# Patient Record
Sex: Female | Born: 1945 | Race: White | Marital: Single | Smoking: Former smoker
Health system: Southern US, Community
[De-identification: ages and names within clinical notes are randomized; demographics above are authoritative.]

## PROBLEM LIST (undated history)

## (undated) DIAGNOSIS — M1990 Unspecified osteoarthritis, unspecified site: Secondary | ICD-10-CM

## (undated) DIAGNOSIS — I7300 Raynaud's syndrome without gangrene: Secondary | ICD-10-CM

## (undated) DIAGNOSIS — T7840XA Allergy, unspecified, initial encounter: Secondary | ICD-10-CM

## (undated) DIAGNOSIS — C801 Malignant (primary) neoplasm, unspecified: Secondary | ICD-10-CM

## (undated) DIAGNOSIS — M8580 Other specified disorders of bone density and structure, unspecified site: Secondary | ICD-10-CM

## (undated) DIAGNOSIS — E785 Hyperlipidemia, unspecified: Secondary | ICD-10-CM

## (undated) DIAGNOSIS — K219 Gastro-esophageal reflux disease without esophagitis: Secondary | ICD-10-CM

## (undated) HISTORY — DX: Gastro-esophageal reflux disease without esophagitis: K21.9

## (undated) HISTORY — PX: HAND SURGERY: SHX662

## (undated) HISTORY — DX: Hyperlipidemia, unspecified: E78.5

## (undated) HISTORY — DX: Raynaud's syndrome without gangrene: I73.00

## (undated) HISTORY — PX: EYE SURGERY: SHX253

## (undated) HISTORY — DX: Unspecified osteoarthritis, unspecified site: M19.90

## (undated) HISTORY — DX: Malignant (primary) neoplasm, unspecified: C80.1

## (undated) HISTORY — PX: COLONOSCOPY: SHX174

## (undated) HISTORY — DX: Other specified disorders of bone density and structure, unspecified site: M85.80

## (undated) HISTORY — PX: CATARACT EXTRACTION: SUR2

## (undated) HISTORY — DX: Allergy, unspecified, initial encounter: T78.40XA

---
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
## 1977-06-24 HISTORY — PX: UMBILICAL HERNIA REPAIR: SHX196

## 1978-06-24 HISTORY — PX: TUBAL LIGATION: SHX77

## 1996-06-24 DIAGNOSIS — C4441 Basal cell carcinoma of skin of scalp and neck: Secondary | ICD-10-CM | POA: Insufficient documentation

## 1996-06-24 DIAGNOSIS — C801 Malignant (primary) neoplasm, unspecified: Secondary | ICD-10-CM

## 1996-06-24 HISTORY — DX: Malignant (primary) neoplasm, unspecified: C80.1

## 2002-06-24 HISTORY — PX: CATARACT EXTRACTION W/ INTRAOCULAR LENS IMPLANT: SHX1309

## 2006-06-24 HISTORY — PX: CYSTECTOMY: SUR359

## 2011-10-04 ENCOUNTER — Encounter: Payer: Self-pay | Admitting: Internal Medicine

## 2011-11-11 ENCOUNTER — Encounter: Payer: Self-pay | Admitting: Internal Medicine

## 2011-11-11 ENCOUNTER — Ambulatory Visit (AMBULATORY_SURGERY_CENTER): Payer: BC Managed Care – PPO | Admitting: *Deleted

## 2011-11-11 VITALS — Ht 63.0 in | Wt 129.0 lb

## 2011-11-11 DIAGNOSIS — Z1211 Encounter for screening for malignant neoplasm of colon: Secondary | ICD-10-CM

## 2011-11-11 MED ORDER — PEG-KCL-NACL-NASULF-NA ASC-C 100 G PO SOLR: ORAL | Status: DC

## 2011-11-14 ENCOUNTER — Encounter: Payer: Self-pay | Admitting: Internal Medicine

## 2011-11-27 ENCOUNTER — Ambulatory Visit (AMBULATORY_SURGERY_CENTER): Payer: BC Managed Care – PPO | Admitting: Internal Medicine

## 2011-11-27 ENCOUNTER — Encounter: Payer: Self-pay | Admitting: Internal Medicine

## 2011-11-27 VITALS — BP 131/75 | HR 68 | Temp 97.1°F | Resp 16 | Ht 63.0 in | Wt 129.0 lb

## 2011-11-27 DIAGNOSIS — Z1211 Encounter for screening for malignant neoplasm of colon: Secondary | ICD-10-CM

## 2011-11-27 DIAGNOSIS — Z800 Family history of malignant neoplasm of digestive organs: Secondary | ICD-10-CM

## 2011-11-27 MED ORDER — SODIUM CHLORIDE 0.9 % IV SOLN: 500.0000 mL | INTRAVENOUS | Status: DC

## 2011-11-27 NOTE — Progress Notes (Signed)
Patient did not experience any of the following events: a burn prior to discharge; a fall within the facility; wrong site/side/patient/procedure/implant event; or a hospital transfer or hospital admission upon discharge from the facility. (G8907) Patient did not have preoperative order for IV antibiotic SSI prophylaxis. (G8918)  

## 2011-11-27 NOTE — Op Note (Addendum)
[facility] [address] [place], [place]  [address]  COLONOSCOPY PROCEDURE REPORT  PATIENT:  Erin Cruz, Erin Cruz  MR#:  [id number] BIRTHDATE:  1946-05-24, 65 yrs. old  GENDER:  female ENDOSCOPIST:  Hedwig Morton. Juanda Chance, MD REF. BY:  no PCP, pt just moved from [place] PROCEDURE DATE:  11/27/2011 PROCEDURE:  Colonoscopy 27253 ASA CLASS:  Class I INDICATIONS:  colorectal cancer screening, father with colon cancer MEDICATIONS:   MAC sedation, administered by CRNA, propofol (Diprivan) 250 mg  DESCRIPTION OF PROCEDURE:   After the risks and benefits and of the procedure were explained, informed consent was obtained. Digital rectal exam was performed and revealed no rectal masses. The LB CF-[id number] endoscope was introduced through the anus and advanced to the cecum, which was identified by both the appendix and ileocecal valve.  The quality of the prep was good, using MoviPrep.  The instrument was then slowly withdrawn as the colon was fully examined. <<PROCEDUREIMAGES>>  FINDINGS:  No polyps or cancers were seen (see image1, image4, image3, image5, and image6). long redundant tortuous colon, difficult to traverse   Retroflexed views in the rectum revealed no abnormalities.    The scope was then withdrawn from the patient and the procedure completed.  COMPLICATIONS:  None ENDOSCOPIC IMPRESSION: 1) No polyps or cancers 2) Normal colonoscopy long redundant colon RECOMMENDATIONS: 1) High fiber diet.  REPEAT EXAM:  In 5 year(s) for.  ______________________________ Hedwig Morton. Juanda Chance, MD  CC:  n. REVISED:  11/27/2011 12:31 PM eSIGNED:   Hedwig Morton. Savvas Roper at 11/27/2011 12:31 PM  Maxie Better, [phone]

## 2011-11-27 NOTE — Patient Instructions (Signed)
YOU HAD AN ENDOSCOPIC PROCEDURE TODAY AT THE [facility]: Refer to the procedure report that was given to you for any specific questions about what was found during the examination.  If the procedure report does not answer your questions, please call your gastroenterologist to clarify.  If you requested that your care partner not be given the details of your procedure findings, then the procedure report has been included in a sealed envelope for you to review at your convenience later.  YOU SHOULD EXPECT: Some feelings of bloating in the abdomen. Passage of more gas than usual.  Walking can help get rid of the air that was put into your GI tract during the procedure and reduce the bloating. If you had a lower endoscopy (such as a colonoscopy or flexible sigmoidoscopy) you may notice spotting of blood in your stool or on the toilet paper. If you underwent a bowel prep for your procedure, then you may not have a normal bowel movement for a few days.  DIET: Your first meal following the procedure should be a light meal and then it is ok to progress to your normal diet.  A half-sandwich or bowl of soup is an example of a good first meal.  Heavy or fried foods are harder to digest and may make you feel nauseous or bloated.  Likewise meals heavy in dairy and vegetables can cause extra gas to form and this can also increase the bloating.  Drink plenty of fluids but you should avoid alcoholic beverages for 24 hours.  ACTIVITY: Your care partner should take you home directly after the procedure.  You should plan to take it easy, moving slowly for the rest of the day.  You can resume normal activity the day after the procedure however you should NOT DRIVE or use heavy machinery for 24 hours (because of the sedation medicines used during the test).    SYMPTOMS TO REPORT IMMEDIATELY: A gastroenterologist can be reached at any hour.  During normal business hours, 8:30 AM to 5:00 PM Monday through Friday,  call [phone].  After hours and on weekends, please call the GI answering service at [phone] who will take a message and have the physician on call contact you.   Following lower endoscopy (colonoscopy or flexible sigmoidoscopy):  Excessive amounts of blood in the stool  Significant tenderness or worsening of abdominal pains  Swelling of the abdomen that is new, acute  Fever of 100F or higher  FOLLOW UP:  Our staff will call the home number listed on your records the next business day following your procedure to check on you and address any questions or concerns that you may have at that time regarding the information given to you following your procedure. This is a courtesy call and so if there is no answer at the home number and we have not heard from you through the emergency physician on call, we will assume that you have returned to your regular daily activities without incident.  SIGNATURES/CONFIDENTIALITY: You and/or your care partner have signed paperwork which will be entered into your electronic medical record.  These signatures attest to the fact that that the information above on your After Visit Summary has been reviewed and is understood.  Full responsibility of the confidentiality of this discharge information lies with you and/or your care-partner.  

## 2011-11-28 ENCOUNTER — Telehealth: Payer: Self-pay

## 2011-11-28 NOTE — Telephone Encounter (Signed)
Left message on answering machine. 

## 2012-03-11 ENCOUNTER — Encounter: Payer: Self-pay | Admitting: Physician Assistant

## 2012-03-11 ENCOUNTER — Ambulatory Visit (INDEPENDENT_AMBULATORY_CARE_PROVIDER_SITE_OTHER): Payer: BC Managed Care – PPO | Admitting: Physician Assistant

## 2012-03-11 VITALS — BP 122/78 | HR 75 | Temp 98.1°F | Resp 16 | Ht 62.0 in | Wt 133.0 lb

## 2012-03-11 DIAGNOSIS — E785 Hyperlipidemia, unspecified: Secondary | ICD-10-CM

## 2012-03-11 DIAGNOSIS — Z0000 Encounter for general adult medical examination without abnormal findings: Secondary | ICD-10-CM

## 2012-03-11 DIAGNOSIS — Z1239 Encounter for other screening for malignant neoplasm of breast: Secondary | ICD-10-CM

## 2012-03-11 DIAGNOSIS — Z23 Encounter for immunization: Secondary | ICD-10-CM

## 2012-03-11 DIAGNOSIS — M899 Disorder of bone, unspecified: Secondary | ICD-10-CM

## 2012-03-11 DIAGNOSIS — M949 Disorder of cartilage, unspecified: Secondary | ICD-10-CM

## 2012-03-11 DIAGNOSIS — M8580 Other specified disorders of bone density and structure, unspecified site: Secondary | ICD-10-CM

## 2012-03-11 DIAGNOSIS — N289 Disorder of kidney and ureter, unspecified: Secondary | ICD-10-CM

## 2012-03-11 LAB — LIPID PANEL
Cholesterol: 254 mg/dL — ABNORMAL HIGH (ref 0–200)
Total CHOL/HDL Ratio: 5.6 Ratio
Triglycerides: 87 mg/dL (ref <150)
VLDL: 17 mg/dL (ref 0–40)

## 2012-03-11 LAB — CBC WITH DIFFERENTIAL/PLATELET
Eosinophils Absolute: 0.2 10*3/uL (ref 0.0–0.7)
HCT: 41.7 % (ref 36.0–46.0)
Hemoglobin: 14.1 g/dL (ref 12.0–15.0)
Lymphs Abs: 1.5 10*3/uL (ref 0.7–4.0)
MCH: 29.7 pg (ref 26.0–34.0)
Monocytes Absolute: 0.6 10*3/uL (ref 0.1–1.0)
Monocytes Relative: 10 % (ref 3–12)
Neutrophils Relative %: 59 % (ref 43–77)
RBC: 4.74 MIL/uL (ref 3.87–5.11)

## 2012-03-11 LAB — POCT URINALYSIS DIPSTICK
Blood, UA: NEGATIVE
Protein, UA: NEGATIVE
Spec Grav, UA: 1.02
Urobilinogen, UA: 0.2

## 2012-03-11 LAB — COMPREHENSIVE METABOLIC PANEL
Albumin: 4.2 g/dL (ref 3.5–5.2)
Alkaline Phosphatase: 67 U/L (ref 39–117)
BUN: 15 mg/dL (ref 6–23)
Creat: 0.96 mg/dL (ref 0.50–1.10)
Glucose, Bld: 78 mg/dL (ref 70–99)
Potassium: 4.5 mEq/L (ref 3.5–5.3)
Total Bilirubin: 0.8 mg/dL (ref 0.3–1.2)

## 2012-03-12 ENCOUNTER — Encounter: Payer: Self-pay | Admitting: Physician Assistant

## 2012-03-12 NOTE — Progress Notes (Signed)
  Subjective:    Patient ID: Erin Cruz, female    DOB: 1946/03/01, 66 y.o.   MRN: [id number]  HPI 66 yr old CF presents to establish as PCP and for CPE. Last mmg was in 2012.  H/o osteopenia and took fosamax for years.  She isn't sure when her last dexa scan was. She moved here from up [place].  Her sister lives in [place]. No h/o abnormal pap or MMG.   Review of Systems  All other systems reviewed and are negative.       Objective:   Physical Exam  Nursing note and vitals reviewed. Constitutional: She is oriented to person, place, and time. She appears well-developed and well-nourished.  HENT:  Head: Normocephalic and atraumatic.  Right Ear: External ear normal.  Left Ear: External ear normal.  Nose: Nose normal.  Mouth/Throat: Oropharynx is clear and moist. No oropharyngeal exudate.  Eyes: Conjunctivae normal and EOM are normal. Pupils are equal, round, and reactive to light. Right eye exhibits no discharge. Left eye exhibits no discharge. No scleral icterus.  Neck: Normal range of motion. Neck supple. No thyromegaly present.  Cardiovascular: Normal rate, regular rhythm, normal heart sounds and intact distal pulses.   Pulmonary/Chest: Effort normal and breath sounds normal. Right breast exhibits no inverted nipple, no mass, no nipple discharge, no skin change and no tenderness. Left breast exhibits no inverted nipple, no mass, no nipple discharge, no skin change and no tenderness. Breasts are symmetrical.  Abdominal: Soft. Bowel sounds are normal.  Musculoskeletal: Normal range of motion.  Lymphadenopathy:       Head (right side): No preauricular, no posterior auricular and no occipital adenopathy present.       Head (left side): No preauricular, no posterior auricular and no occipital adenopathy present.    She has no cervical adenopathy.       Right cervical: No posterior cervical adenopathy present.      Left cervical: No posterior cervical adenopathy present.    She  has no axillary adenopathy.       Right axillary: No pectoral and no lateral adenopathy present.       Left axillary: No pectoral and no lateral adenopathy present.      Right: No supraclavicular adenopathy present.       Left: No supraclavicular adenopathy present.  Neurological: She is alert and oriented to person, place, and time. She displays normal reflexes. She exhibits normal muscle tone.  Skin: Skin is warm and dry.  Psychiatric: She has a normal mood and affect. Her behavior is normal.        Assessment & Plan:  CPE-healthy female Set up MMG. Check labs. Patient will check status of when her last dexa scan was.

## 2012-03-13 MED ORDER — LOVASTATIN 20 MG PO TABS: 20.0000 mg | ORAL_TABLET | Freq: Every day | ORAL | Status: DC

## 2012-03-13 NOTE — Addendum Note (Signed)
Addended by: Anders Simmonds on: 03/13/2012 11:04 AM   Modules accepted: Orders

## 2012-03-25 ENCOUNTER — Telehealth: Payer: Self-pay

## 2012-03-25 DIAGNOSIS — Z139 Encounter for screening, unspecified: Secondary | ICD-10-CM

## 2012-03-25 NOTE — Telephone Encounter (Signed)
Ellie was seen 03/11/12 and needs to have mammogram referral put in system.     [phone]

## 2012-03-25 NOTE — Telephone Encounter (Signed)
Spoke w/pt and advised that we have started referral for her mammogram and she will be contacted when appt is set up.

## 2012-03-30 ENCOUNTER — Telehealth: Payer: Self-pay

## 2012-03-30 MED ORDER — ZOSTER VACCINE LIVE 19400 UNT/0.65ML ~~LOC~~ SOLR: 0.6500 mL | Freq: Once | SUBCUTANEOUS | Status: DC

## 2012-03-30 NOTE — Telephone Encounter (Signed)
Called pt [facility] RX sent.

## 2012-03-30 NOTE — Telephone Encounter (Signed)
Erin Cruz would like shingles vaccine prescription sent into [facility].  [phone]

## 2012-03-30 NOTE — Telephone Encounter (Signed)
zostavax pended, please sign if appropriate.

## 2012-03-30 NOTE — Telephone Encounter (Signed)
done

## 2012-04-21 ENCOUNTER — Ambulatory Visit
Admission: RE | Admit: 2012-04-21 | Discharge: 2012-04-21 | Disposition: A | Discharge status: Home / Self Care | Payer: BC Managed Care – PPO | Source: Ambulatory Visit | Attending: Physician Assistant | Admitting: Physician Assistant

## 2012-04-21 DIAGNOSIS — Z139 Encounter for screening, unspecified: Secondary | ICD-10-CM

## 2012-07-23 ENCOUNTER — Telehealth: Payer: Self-pay

## 2012-07-23 NOTE — Telephone Encounter (Signed)
Copy of labs from last visit  Dennie Bible will pick up when ready   [phone]

## 2012-07-23 NOTE — Telephone Encounter (Signed)
Ready for pickup. Left message for patient.

## 2012-09-23 ENCOUNTER — Ambulatory Visit: Payer: Self-pay | Admitting: Physician Assistant

## 2012-09-30 ENCOUNTER — Ambulatory Visit (INDEPENDENT_AMBULATORY_CARE_PROVIDER_SITE_OTHER): Payer: BC Managed Care – PPO | Admitting: Family Medicine

## 2012-09-30 ENCOUNTER — Encounter: Payer: Self-pay | Admitting: Family Medicine

## 2012-09-30 VITALS — BP 112/69 | HR 70 | Temp 97.9°F | Resp 16 | Ht 62.0 in | Wt 132.0 lb

## 2012-09-30 DIAGNOSIS — E7800 Pure hypercholesterolemia, unspecified: Secondary | ICD-10-CM

## 2012-09-30 LAB — LIPID PANEL
Cholesterol: 203 mg/dL — ABNORMAL HIGH (ref 0–200)
Total CHOL/HDL Ratio: 4.1 Ratio
Triglycerides: 63 mg/dL (ref <150)

## 2012-09-30 LAB — ALT: ALT: 17 U/L (ref 0–35)

## 2012-09-30 NOTE — Patient Instructions (Signed)

## 2012-10-01 ENCOUNTER — Encounter: Payer: Self-pay | Admitting: Family Medicine

## 2012-10-01 NOTE — Progress Notes (Signed)
S:  This 67 y.o. Cauc female is here for lab draw; she was started on Lovastatin 20 mg in Sept 2013. Pt reports normal cholesterol for years but the number has gradually crept up in last year despite heathy nutrition and active lifestyle.  There is a family hx of high lipids. Pt denies myalgias, arthralgias or GI problems w/ medications.  O:  Filed Vitals:   09/30/12 1104  BP: 112/69  Pulse: 70  Temp: 97.9 F (36.6 C)  Resp: 16   GEN: In AND: WN,WD. HENT: [place]/AT; EOMI w/ clear conj/sclerae. Otherwise normal. COR: RRR. LUNGS: Normal resp rate and effort. MS: MAEs; no deformities. NEURO: A&O x 3; CNs intact. Nonfocal.  A/P: Pure hypercholesterolemia - Continue Lovastatin 20 mg pending labs.  Plan: TSH, T4, Free, ALT, Lipid panel

## 2013-04-08 ENCOUNTER — Encounter: Payer: Self-pay | Admitting: Family Medicine

## 2013-04-08 ENCOUNTER — Ambulatory Visit (INDEPENDENT_AMBULATORY_CARE_PROVIDER_SITE_OTHER): Payer: Medicare Other | Admitting: Family Medicine

## 2013-04-08 VITALS — BP 120/70 | HR 68 | Temp 97.6°F | Resp 16 | Ht 62.5 in | Wt 132.0 lb

## 2013-04-08 DIAGNOSIS — T887XXA Unspecified adverse effect of drug or medicament, initial encounter: Secondary | ICD-10-CM

## 2013-04-08 DIAGNOSIS — E7800 Pure hypercholesterolemia, unspecified: Secondary | ICD-10-CM

## 2013-04-08 DIAGNOSIS — T50905A Adverse effect of unspecified drugs, medicaments and biological substances, initial encounter: Secondary | ICD-10-CM

## 2013-04-08 DIAGNOSIS — Z1231 Encounter for screening mammogram for malignant neoplasm of breast: Secondary | ICD-10-CM

## 2013-04-08 DIAGNOSIS — Z23 Encounter for immunization: Secondary | ICD-10-CM

## 2013-04-08 DIAGNOSIS — Z8739 Personal history of other diseases of the musculoskeletal system and connective tissue: Secondary | ICD-10-CM

## 2013-04-08 NOTE — Patient Instructions (Signed)
Discontinue Lovastatin due to muscle aches..Increase Fish Oil to twice a day and try the [facility] brand.  Research the MEDITERRANEAN DIET and stay physically active. Lipid panel can be rechecked in 4 months; I will order the test and you can come in without an appointment to get the labs drawn.

## 2013-04-09 ENCOUNTER — Encounter: Payer: Self-pay | Admitting: Family Medicine

## 2013-04-09 NOTE — Progress Notes (Signed)
S:  This 67 y.o. Cauc female returns for follow-up re: elevated cholesterol and medication treatment w/ lovastatin. Pt has discontinued the medication due to muscle aches. Initially she attributed  Myalgias to increased activity level; but the discomfort persisted even w/ sedentary activity. She does not want to try another medication/statin. The muscles aches resolved after statin was discontinued. She denies abd pain, GI upset or jaundice.  Pt also has a hx of osteopenia; last DEXA > 10 years ago. Her previous physician discussed prescription ERT because she went through early menopause about 20 years ago; she declined this treatment and has taken calcium + D supplement and maintains active lifestyle.  Patient Active Problem List   Diagnosis Date Noted  . Pure hypercholesterolemia 04/08/2013   PMHx, Soc Hx and Fam Hx reviewed.  ROS: AS per HPI>  O: Filed Vitals:   04/08/13 0845  BP: 120/70  Pulse: 68  Temp: 97.6 F (36.4 C)  Resp: 16   GEN: In NAD; WN,WD. HENT: [place]/AT; EOMI w/ clear conj/sclerae. Otherwise unremarkable. COR: RRR. No edema. LUNGS: Normal resp rate and effort. SKIN: W&D; intact w/o erythema, rashes, jaundice or pallor. MS: MAEs; no muscle tenderness or joint effusions or deformities. No c/c/e. NEURO: A&O x 3; CNs intact. Nonfocal.  A/P: Pure hypercholesterolemia - Statin intolerance. Continue Fish Oil; pt to try [employer] and further diet modifications. CVD risk is about average; pt made aware of this.    Plan: Lipid panel, Comprehensive metabolic panel  Medication side effect, initial encounter- Discontinue lovastatin.  Need for prophylactic vaccination and inoculation against influenza - Plan: Flu Vaccine QUAD 36+ mos IM  History of osteopenia - Plan: DG Bone Density  Other screening mammogram - Plan: MM Digital Screening  Meds ordered this encounter  Medications  . Coenzyme Q10 (CO Q 10 PO)    Sig: Take 300 mg by mouth.

## 2013-05-12 ENCOUNTER — Ambulatory Visit
Admission: RE | Admit: 2013-05-12 | Discharge: 2013-05-12 | Disposition: A | Discharge status: Home / Self Care | Payer: Medicare Other | Source: Ambulatory Visit | Attending: Family Medicine | Admitting: Family Medicine

## 2013-05-12 DIAGNOSIS — Z1231 Encounter for screening mammogram for malignant neoplasm of breast: Secondary | ICD-10-CM

## 2013-05-12 DIAGNOSIS — Z8739 Personal history of other diseases of the musculoskeletal system and connective tissue: Secondary | ICD-10-CM

## 2013-05-14 ENCOUNTER — Encounter: Payer: Self-pay | Admitting: Family Medicine

## 2013-10-04 ENCOUNTER — Other Ambulatory Visit (INDEPENDENT_AMBULATORY_CARE_PROVIDER_SITE_OTHER): Payer: Medicare Other | Admitting: *Deleted

## 2013-10-04 DIAGNOSIS — E7800 Pure hypercholesterolemia, unspecified: Secondary | ICD-10-CM

## 2013-10-04 DIAGNOSIS — Z139 Encounter for screening, unspecified: Secondary | ICD-10-CM

## 2013-10-04 LAB — CBC
HEMATOCRIT: 37.5 % (ref 36.0–46.0)
HEMOGLOBIN: 12.8 g/dL (ref 12.0–15.0)
MCH: 29.2 pg (ref 26.0–34.0)
MCHC: 34.1 g/dL (ref 30.0–36.0)
MCV: 85.6 fL (ref 78.0–100.0)
Platelets: 240 10*3/uL (ref 150–400)
RBC: 4.38 MIL/uL (ref 3.87–5.11)
RDW: 14.1 % (ref 11.5–15.5)
WBC: 4.9 10*3/uL (ref 4.0–10.5)

## 2013-10-04 LAB — COMPLETE METABOLIC PANEL WITH GFR
ALBUMIN: 4 g/dL (ref 3.5–5.2)
ALT: 13 U/L (ref 0–35)
AST: 17 U/L (ref 0–37)
Alkaline Phosphatase: 66 U/L (ref 39–117)
BUN: 17 mg/dL (ref 6–23)
CHLORIDE: 107 meq/L (ref 96–112)
CO2: 26 mEq/L (ref 19–32)
Calcium: 9 mg/dL (ref 8.4–10.5)
Creat: 0.97 mg/dL (ref 0.50–1.10)
GFR, EST NON AFRICAN AMERICAN: 61 mL/min
GFR, Est African American: 71 mL/min
GLUCOSE: 83 mg/dL (ref 70–99)
POTASSIUM: 4.4 meq/L (ref 3.5–5.3)
Sodium: 141 mEq/L (ref 135–145)
TOTAL PROTEIN: 6.3 g/dL (ref 6.0–8.3)
Total Bilirubin: 0.5 mg/dL (ref 0.2–1.2)

## 2013-10-04 LAB — LIPID PANEL
CHOL/HDL RATIO: 5.4 ratio
Cholesterol: 222 mg/dL — ABNORMAL HIGH (ref 0–200)
HDL: 41 mg/dL (ref >39)
LDL Cholesterol: 168 mg/dL — ABNORMAL HIGH (ref 0–99)
Triglycerides: 65 mg/dL (ref <150)
VLDL: 13 mg/dL (ref 0–40)

## 2013-10-04 LAB — TSH: TSH: 3.208 u[IU]/mL (ref 0.350–4.500)

## 2013-10-04 NOTE — Progress Notes (Signed)
Patient here to have blood drawn for CPE on 10/05/2013. Dr Lorelei Pont gave orders on 10/04/2013.

## 2013-10-05 ENCOUNTER — Encounter: Payer: Self-pay | Admitting: Family Medicine

## 2013-10-05 ENCOUNTER — Ambulatory Visit (INDEPENDENT_AMBULATORY_CARE_PROVIDER_SITE_OTHER): Payer: Medicare Other | Admitting: Family Medicine

## 2013-10-05 VITALS — BP 122/70 | HR 64 | Temp 98.0°F | Resp 16 | Ht 61.0 in | Wt 133.4 lb

## 2013-10-05 DIAGNOSIS — E7800 Pure hypercholesterolemia, unspecified: Secondary | ICD-10-CM

## 2013-10-05 DIAGNOSIS — Z0000 Encounter for general adult medical examination without abnormal findings: Secondary | ICD-10-CM

## 2013-10-05 DIAGNOSIS — Z01419 Encounter for gynecological examination (general) (routine) without abnormal findings: Secondary | ICD-10-CM

## 2013-10-05 DIAGNOSIS — M5136 Other intervertebral disc degeneration, lumbar region: Secondary | ICD-10-CM | POA: Insufficient documentation

## 2013-10-05 DIAGNOSIS — Z124 Encounter for screening for malignant neoplasm of cervix: Secondary | ICD-10-CM

## 2013-10-05 DIAGNOSIS — Z139 Encounter for screening, unspecified: Secondary | ICD-10-CM

## 2013-10-05 LAB — IFOBT (OCCULT BLOOD): IFOBT: NEGATIVE

## 2013-10-05 LAB — POCT URINALYSIS DIPSTICK
Bilirubin, UA: NEGATIVE
Glucose, UA: NEGATIVE
Ketones, UA: NEGATIVE
Leukocytes, UA: NEGATIVE
Nitrite, UA: NEGATIVE
PH UA: 5
PROTEIN UA: NEGATIVE
Urobilinogen, UA: 0.2

## 2013-10-05 NOTE — Progress Notes (Signed)
Subjective:    Patient ID: Erin Cruz, female    DOB: 1945/08/13, 68 y.o.   MRN: [id number]  HPI  This 68 y.o. Cauc female is here for [facility] [place]. She is in good health w/ only chronic medical problems being familial hypercholesterolemia and lumbar DDD. Statins cause myalgias so she is interested in other non-pharmacologic means of lowering cholesterol. DDD is being treated conservatively by Dr. Rennis Harding.  HCM: MMG- Current.           PAP- > 3 years.           IMM- Needs Tdap/Tetanus.            CRS- Current.  Patient Active Problem List   Diagnosis Date Noted  . Pure hypercholesterolemia 04/08/2013   Prior to Admission medications   Medication Sig Start Date End Date Taking? Authorizing Provider  calcium citrate-vitamin D (CITRACAL+D) 315-200 MG-UNIT per tablet Take 1 tablet by mouth 2 (two) times daily.   Yes Historical Provider, MD  fish oil-omega-3 fatty acids 1000 MG capsule Take 1,200 mg by mouth daily.   Yes Historical Provider, MD  glucosamine-chondroitin 500-400 MG tablet Take 1 tablet by mouth 3 (three) times daily.   Yes Historical Provider, MD  meloxicam (MOBIC) 15 MG tablet Take 15 mg by mouth daily.   Yes Historical Provider, MD  predniSONE (DELTASONE) 5 MG tablet Take 5 mg by mouth daily with breakfast.   Yes Historical Provider, MD  ranitidine (ZANTAC) 150 MG tablet Take 150 mg by mouth as needed.   Yes Historical Provider, MD  aspirin 81 MG tablet Take 81 mg by mouth every other day.    Historical Provider, MD  Coenzyme Q10 (CO Q 10 PO) Take 300 mg by mouth.    Historical Provider, MD  Misc Natural Products (TART CHERRY ADVANCED) CAPS Take 1 capsule by mouth 2 (two) times daily.    Historical Provider, MD  Multiple Vitamins-Minerals (MULTIVITAMIN WITH MINERALS) tablet Take 1 tablet by mouth daily.    Historical Provider, MD  naproxen sodium (ANAPROX) 220 MG tablet Take 220 mg by mouth daily.    Historical Provider, MD   PMHx, Surg Hx, Soc and Fam Hx  reviewed.   Review of Systems  Constitutional: Negative.   HENT: Negative.   Eyes: Negative.   Respiratory: Negative.   Cardiovascular: Negative.   Gastrointestinal: Negative.   Endocrine: Positive for cold intolerance.  Genitourinary: Negative.   Musculoskeletal: Positive for back pain.  Skin: Negative.   Allergic/Immunologic: Positive for environmental allergies.  Neurological: Negative.   Hematological: Negative.   Psychiatric/Behavioral: Negative.       Objective:   Physical Exam  Nursing note and vitals reviewed. Constitutional: She is oriented to person, place, and time. Vital signs are normal. She appears well-developed and well-nourished. No distress.  HENT:  Head: Normocephalic.  Right Ear: Hearing, tympanic membrane, external ear and ear canal normal.  Left Ear: Hearing, tympanic membrane, external ear and ear canal normal.  Nose: Nose normal. No nasal deformity or septal deviation.  Mouth/Throat: Uvula is midline, oropharynx is clear and moist and mucous membranes are normal. No oral lesions. Normal dentition. No dental caries.  Eyes: Conjunctivae, EOM and lids are normal. Pupils are equal, round, and reactive to light. No scleral icterus.  Professional eye exam current.  Neck: Trachea normal, normal range of motion and full passive range of motion without pain. Neck supple. No spinous process tenderness and no muscular tenderness present. No mass and  no thyromegaly present.  Cardiovascular: Normal rate, regular rhythm, S1 normal, S2 normal, normal heart sounds and normal pulses.   No extrasystoles are present. PMI is not displaced.  Exam reveals no gallop and no friction rub.   No murmur heard. Pulmonary/Chest: Effort normal and breath sounds normal. No respiratory distress.  Abdominal: Soft. Normal appearance and bowel sounds are normal. She exhibits no distension and no mass. There is no hepatosplenomegaly. There is no tenderness. There is no guarding and no CVA  tenderness. Hernia confirmed negative in the right inguinal area and confirmed negative in the left inguinal area.  Genitourinary: Rectum normal, vagina normal and uterus normal. Rectal exam shows no external hemorrhoid, no fissure, no mass, no tenderness and anal tone normal. Guaiac negative stool. There is no rash, tenderness or lesion on the right labia. There is no rash, tenderness or lesion on the left labia. Uterus is not deviated, not enlarged, not fixed and not tender. Cervix exhibits no motion tenderness, no discharge and no friability. Right adnexum displays no mass, no tenderness and no fullness. Left adnexum displays no mass, no tenderness and no fullness. No erythema, tenderness or bleeding around the vagina. No foreign body around the vagina. No signs of injury around the vagina. No vaginal discharge found.  Musculoskeletal: Normal range of motion. She exhibits no edema.       Cervical back: Normal.       Thoracic back: Normal.       Lumbar back: She exhibits tenderness.  Tenderness to L of lower lumbar vertebrae; SI joint tender. Remainder of exam unremarkable.  Lymphadenopathy:       Head (right side): No submental, no submandibular, no tonsillar, no posterior auricular and no occipital adenopathy present.       Head (left side): No submental, no submandibular, no tonsillar, no posterior auricular and no occipital adenopathy present.    She has no cervical adenopathy.    She has no axillary adenopathy.       Right: No inguinal and no supraclavicular adenopathy present.       Left: No inguinal and no supraclavicular adenopathy present.  Neurological: She is alert and oriented to person, place, and time. She has normal strength and normal reflexes. She displays no atrophy and no tremor. No cranial nerve deficit or sensory deficit. She exhibits normal muscle tone. Coordination and gait normal.  Skin: Skin is warm, dry and intact. No bruising, no lesion and no rash noted. She is not  diaphoretic. No cyanosis or erythema. No pallor. Nails show no clubbing.  Psychiatric: She has a normal mood and affect. Her speech is normal and behavior is normal. Judgment and thought content normal. Cognition and memory are normal.    Results for orders placed in visit on 10/05/13  POCT URINALYSIS DIPSTICK      Result Value Ref Range   Color, UA yellow     Clarity, UA clear     Glucose, UA neg     Bilirubin, UA neg     Ketones, UA neg     Spec Grav, UA <=1.005     Blood, UA tr-lysed     pH, UA 5.0     Protein, UA neg     Urobilinogen, UA 0.2     Nitrite, UA neg     Leukocytes, UA Negative    IFOBT (OCCULT BLOOD)      Result Value Ref Range   IFOBT Negative        Assessment &  Plan:  Routine general medical examination at a health care facility - Plan: POCT urinalysis dipstick, IFOBT POC (occult bld, rslt in office)  Encounter for cervical Pap smear with pelvic exam - Plan: Pap IG (Image Guided)  Pure hypercholesterolemia - Recommend Red Rice Yeast as well as other supplements available online. Pt will investigate tis and make other choices that she feels are appropriate for her.      Plan: Lipid panel

## 2013-10-05 NOTE — Patient Instructions (Addendum)
Keeping You Healthy  Get These Tests  Blood Pressure- Have your blood pressure checked by your healthcare provider at least once a year.  Normal blood pressure is 120/80.  Weight- Have your body mass index (BMI) calculated to screen for obesity.  BMI is a measure of body fat based on height and weight.  You can calculate your own BMI at [employer]velB[employer].it  Cholesterol- Have your cholesterol checked every year.  Diabetes- Have your blood sugar checked every year if you have high blood pressure, high cholesterol, a family history of diabetes or if you are overweight.  Pap Smear- Have a pap smear every 1 to 3 years if you have been sexually active.  If you are older than 65 and recent pap smears have been normal you may not need additional pap smears.  In addition, if you have had a hysterectomy  For benign disease additional pap smears are not necessary.  Mammogram-Yearly mammograms are essential for early detection of breast cancer  Screening for Colon Cancer- Colonoscopy starting at age 65. Screening may begin sooner depending on your family history and other health conditions.  Follow up colonoscopy as directed by your Gastroenterologist.  Screening for Osteoporosis- Screening begins at age 5 with bone density scanning, sooner if you are at higher risk for developing Osteoporosis.  Get these medicines  Calcium with Vitamin D- Your body requires 1200-1500 mg of Calcium a day and [id number] IU of Vitamin D a day.  You can only absorb 500 mg of Calcium at a time therefore Calcium must be taken in 2 or 3 separate doses throughout the day.  Hormones- Hormone therapy has been associated with increased risk for certain cancers and heart disease.  Talk to your healthcare provider about if you need relief from menopausal symptoms.  Aspirin- Ask your healthcare provider about taking Aspirin to prevent Heart Disease and Stroke.  Get these Immuniztions  Flu shot- Every fall  Pneumonia  shot- Once after the age of 57; if you are younger ask your healthcare provider if you need a pneumonia shot. You report that you had this vaccine in November 2014.  Tetanus- Every ten years. You will need to get this vaccine at tour earliest convenience.  Zostavax- Once after the age of 3 to prevent shingles.  Take these steps  Don't smoke- Your healthcare provider can help you quit. For tips on how to quit, ask your healthcare provider or go to www.smokefree.gov or call 1-800 QUIT-NOW.  Be physically active- Exercise 5 days a week for a minimum of 30 minutes.  If you are not already physically active, start slow and gradually work up to 30 minutes of moderate physical activity.  Try walking, dancing, bike riding, swimming, etc.  Eat a healthy diet- Eat a variety of healthy foods such as fruits, vegetables, whole grains, low fat milk, low fat cheeses, yogurt, lean meats, chicken, fish, eggs, dried beans, tofu, etc.  For more information go to www.thenutritionsource.org  Dental visit- Brush and floss teeth twice daily; visit your dentist twice a year.  Eye exam- Visit your Optometrist or Ophthalmologist yearly.  Drink alcohol in moderation- Limit alcohol intake to one drink or less a day.  Never drink and drive.  Depression- Your emotional health is as important as your physical health.  If you're feeling down or losing interest in things you normally enjoy, please talk to your healthcare provider.  Seat Belts- can save your life; always wear one  Smoke/Carbon Monoxide detectors- These detectors need to  be installed on the appropriate level of your home.  Replace batteries at least once a year.  Violence- If anyone is threatening or hurting you, please tell your healthcare provider.  Living Will/ Health care power of attorney- Discuss with your healthcare provider and family.     To lower cholesterol- I recommend you try the Red Rice Yeast. You can get this at [facility], [employer] or  [employer]. Also, check the supplements manufactured by Dr. Jeannetta Nap; he sells his products through [employer] (IRareTurn.co.nz) I will order future labs (~ 6 months), you can walk in here at 104 and get fasting labs drawn.  Joe and Rebeca Alert are an husband and wife who provide valuable information about healthy living. They have a website that you can find a link to via [facility] (public radio).

## 2013-10-06 LAB — PAP IG (IMAGE GUIDED)

## 2013-10-07 ENCOUNTER — Encounter: Payer: Self-pay | Admitting: Family Medicine

## 2014-04-15 ENCOUNTER — Other Ambulatory Visit (HOSPITAL_COMMUNITY): Payer: Self-pay | Admitting: Orthopaedic Surgery

## 2014-04-15 DIAGNOSIS — Q762 Congenital spondylolisthesis: Secondary | ICD-10-CM

## 2014-05-02 ENCOUNTER — Ambulatory Visit (HOSPITAL_COMMUNITY)
Admission: RE | Admit: 2014-05-02 | Discharge: 2014-05-02 | Disposition: A | Discharge status: Home / Self Care | Payer: Medicare Other | Source: Ambulatory Visit | Attending: Orthopaedic Surgery | Admitting: Orthopaedic Surgery

## 2014-05-02 DIAGNOSIS — M479 Spondylosis, unspecified: Secondary | ICD-10-CM | POA: Insufficient documentation

## 2014-05-02 DIAGNOSIS — M79605 Pain in left leg: Secondary | ICD-10-CM | POA: Diagnosis present

## 2014-05-02 DIAGNOSIS — M4806 Spinal stenosis, lumbar region: Secondary | ICD-10-CM | POA: Diagnosis not present

## 2014-05-02 DIAGNOSIS — M5136 Other intervertebral disc degeneration, lumbar region: Secondary | ICD-10-CM | POA: Insufficient documentation

## 2014-05-02 DIAGNOSIS — M5137 Other intervertebral disc degeneration, lumbosacral region: Secondary | ICD-10-CM | POA: Insufficient documentation

## 2014-05-02 DIAGNOSIS — M545 Low back pain: Secondary | ICD-10-CM | POA: Diagnosis present

## 2014-05-02 DIAGNOSIS — M79604 Pain in right leg: Secondary | ICD-10-CM | POA: Diagnosis present

## 2014-05-02 DIAGNOSIS — Q762 Congenital spondylolisthesis: Secondary | ICD-10-CM

## 2014-06-07 ENCOUNTER — Telehealth: Payer: Self-pay

## 2014-06-07 NOTE — Telephone Encounter (Signed)
Spoke to pt; informed pt for yearly mammogram she does not need a referral. Told [facility]. Informed her to call back if she has any trouble.

## 2014-06-07 NOTE — Telephone Encounter (Signed)
PATIENT WOULD LIKE DR. MCPHERSON TO KNOW THAT IT IS TIME FOR HER TO GET HER YEARLY MAMMOGRAM. SHE WOULD LIKE DR. MCPHERSON TO PUT IN AN ORDER FOR HER TO HAVE IT DONE. SHE USUALLY GOES TO THE [facility] [place]. BEST PHONE [phone]   [place]

## 2014-07-04 HISTORY — PX: SPINE SURGERY: SHX786

## 2014-08-04 ENCOUNTER — Telehealth: Payer: Self-pay | Admitting: *Deleted

## 2014-08-04 NOTE — Telephone Encounter (Signed)
Re faxed request for medical records sent on 08/02/2014, parts of records were illegible. Requested from [facility] - [phone].

## 2014-10-17 ENCOUNTER — Other Ambulatory Visit: Payer: Self-pay

## 2014-10-17 DIAGNOSIS — Z1231 Encounter for screening mammogram for malignant neoplasm of breast: Secondary | ICD-10-CM

## 2014-11-02 ENCOUNTER — Ambulatory Visit
Admission: RE | Admit: 2014-11-02 | Discharge: 2014-11-02 | Disposition: A | Discharge status: Home / Self Care | Payer: Medicare HMO | Source: Ambulatory Visit

## 2014-11-02 DIAGNOSIS — Z1231 Encounter for screening mammogram for malignant neoplasm of breast: Secondary | ICD-10-CM

## 2014-11-17 ENCOUNTER — Encounter: Payer: Self-pay | Admitting: Family Medicine

## 2014-11-17 ENCOUNTER — Ambulatory Visit (INDEPENDENT_AMBULATORY_CARE_PROVIDER_SITE_OTHER): Payer: Medicare HMO | Admitting: Family Medicine

## 2014-11-17 VITALS — BP 132/80 | HR 70 | Temp 98.1°F | Resp 16 | Ht 61.25 in | Wt 133.2 lb

## 2014-11-17 DIAGNOSIS — Z0000 Encounter for general adult medical examination without abnormal findings: Secondary | ICD-10-CM

## 2014-11-17 DIAGNOSIS — Z1212 Encounter for screening for malignant neoplasm of rectum: Secondary | ICD-10-CM | POA: Diagnosis not present

## 2014-11-17 DIAGNOSIS — Z1211 Encounter for screening for malignant neoplasm of colon: Secondary | ICD-10-CM

## 2014-11-17 DIAGNOSIS — E7800 Pure hypercholesterolemia, unspecified: Secondary | ICD-10-CM

## 2014-11-17 LAB — COMPLETE METABOLIC PANEL WITH GFR
ALT: 16 U/L (ref 0–35)
AST: 24 U/L (ref 0–37)
Albumin: 4.2 g/dL (ref 3.5–5.2)
Alkaline Phosphatase: 86 U/L (ref 39–117)
BILIRUBIN TOTAL: 0.7 mg/dL (ref 0.2–1.2)
BUN: 13 mg/dL (ref 6–23)
CHLORIDE: 104 meq/L (ref 96–112)
CO2: 25 meq/L (ref 19–32)
Calcium: 9.6 mg/dL (ref 8.4–10.5)
Creat: 1.02 mg/dL (ref 0.50–1.10)
GFR, EST AFRICAN AMERICAN: 66 mL/min
GFR, Est Non African American: 57 mL/min — ABNORMAL LOW
Glucose, Bld: 83 mg/dL (ref 70–99)
Potassium: 4.4 mEq/L (ref 3.5–5.3)
SODIUM: 142 meq/L (ref 135–145)
Total Protein: 7 g/dL (ref 6.0–8.3)

## 2014-11-17 LAB — LIPID PANEL
Cholesterol: 232 mg/dL — ABNORMAL HIGH (ref 0–200)
HDL: 38 mg/dL — AB (ref >=46)
LDL Cholesterol: 175 mg/dL — ABNORMAL HIGH (ref 0–99)
TRIGLYCERIDES: 97 mg/dL (ref <150)
Total CHOL/HDL Ratio: 6.1 Ratio
VLDL: 19 mg/dL (ref 0–40)

## 2014-11-17 NOTE — Progress Notes (Signed)
Subjective:    Patient ID: Erin Cruz, female    DOB: 03-12-1946, 69 y.o.   MRN: [id number]  HPI  This 69 y.o female is here for [facility] Subsequent Annual CPE. She is well and has no new complaints. Pt underwent lumbar surgery in Jan 2016 w/ excellent result; she is pain-free. Pt has elevated cholesterol which she treats a/ Fish Oil; she declines to take statins.  April 2015 results:  TChol= 222  TGs= 65  HDL=41  LDL= 168   TChol/HDL ratio= 5.4  HCM: PAP- 2015 (negative).           MMG- Current.           CRS- 2013 w/ 5-yr recall.           IMM-  Current; will consider Prevnar 13.           Vision- Current.           Dental- Current.  Patient Active Problem List   Diagnosis Date Noted  . DDD (degenerative disc disease), lumbar 10/05/2013  . Pure hypercholesterolemia 04/08/2013    Prior to Admission medications   Medication Sig Start Date End Date Taking? Authorizing Provider  calcium citrate-vitamin D (CITRACAL+D) 315-200 MG-UNIT per tablet Take 1 tablet by mouth 2 (two) times daily.   Yes Historical Provider, MD  glucosamine-chondroitin 500-400 MG tablet Take 1 tablet by mouth 3 (three) times daily.   Yes Historical Provider, MD  Multiple Vitamins-Minerals (MULTIVITAMIN WITH MINERALS) tablet Take 1 tablet by mouth daily.   Yes Historical Provider, MD  ranitidine (ZANTAC) 150 MG tablet Take 150 mg by mouth as needed.   Yes Historical Provider, MD  fish oil-omega-3 fatty acids 1000 MG capsule Take 1,200 mg by mouth daily.    Historical Provider, MD   Past Surgical History  Procedure Laterality Date  . Cataract extraction w/ intraocular lens implant  2004    right  . Cystectomy  2008    benign  . Tubal ligation  1980  . Umbilical hernia repair  1979  . Hand surgery  1965 & 1970    ganglion cyst x 2  . Popliteal synovial cyst excision  1961    left knee  . Hernia repair    . Eye surgery    . Spine surgery  07/04/2014    L4 and L5 disc replacement and fusion L4-S1- Dr.  Patrice Paradise    History   Social History  . Marital Status: Single    Spouse Name: N/A  . Number of Children: N/A  . Years of Education: N/A   Occupational History  . retired    Social History Main Topics  . Smoking status: Former Research scientist (life sciences)  . Smokeless tobacco: Never Used  . Alcohol Use: 1.2 oz/week    2 Glasses of wine per week  . Drug Use: No  . Sexual Activity: No   Other Topics Concern  . Not on file   Social History Narrative   Exercise walking 5 days/week for 30 minutes    Family History  Problem Relation Age of Onset  . Colon cancer Father   . Cancer Father   . Hyperlipidemia Father   . Colon cancer Maternal Grandmother   . Hypertension Sister     Review of Systems  Constitutional: Negative.   HENT: Negative.   Eyes: Negative.   Respiratory: Negative.   Cardiovascular: Negative.   Gastrointestinal: Negative.   Endocrine: Negative.   Genitourinary: Negative.   Musculoskeletal: Negative.  Skin: Negative.   Allergic/Immunologic: Negative.   Neurological: Negative.   Hematological: Negative.   Psychiatric/Behavioral: Negative.        Objective:   Physical Exam  Constitutional: She is oriented to person, place, and time. Vital signs are normal. She appears well-developed and well-nourished. No distress.  Blood pressure 132/80, pulse 70, temperature 98.1 F (36.7 C), temperature source Oral, resp. rate 16, height 5' 1.25" (1.556 m), weight 133 lb 3.2 oz (60.419 kg), SpO2 99 %.   HENT:  Head: Normocephalic and atraumatic.  Right Ear: Hearing, tympanic membrane, external ear and ear canal normal.  Left Ear: Tympanic membrane, external ear and ear canal normal. Decreased hearing is noted.  Nose: Nose normal. No mucosal edema, nasal deformity or septal deviation.  Mouth/Throat: Uvula is midline, oropharynx is clear and moist and mucous membranes are normal. No oral lesions. Normal dentition. No dental caries.  Eyes: Conjunctivae, EOM and lids are normal.  Pupils are equal, round, and reactive to light. No scleral icterus.  Neck: Trachea normal, normal range of motion, full passive range of motion without pain and phonation normal. Neck supple. No JVD present. No spinous process tenderness and no muscular tenderness present. Carotid bruit is not present. No thyroid mass and no thyromegaly present.  Cardiovascular: Normal rate, regular rhythm, S1 normal, S2 normal, normal heart sounds, intact distal pulses and normal pulses.   No extrasystoles are present. PMI is not displaced.  Exam reveals no gallop and no friction rub.   No murmur heard. Pulmonary/Chest: Effort normal and breath sounds normal. No respiratory distress. She has no decreased breath sounds. She has no wheezes. She has no rhonchi.  Abdominal: Soft. Normal appearance, normal aorta and bowel sounds are normal. She exhibits no distension and no mass. There is no hepatosplenomegaly. There is no tenderness. There is no guarding and no CVA tenderness.  Genitourinary:  Deferred.  Musculoskeletal:       Cervical back: Normal.       Thoracic back: Normal.       Lumbar back: She exhibits no tenderness, no edema, no pain and no spasm.  Lumbar- well healed midline scar. Remainder of exam unremarkable.  Lymphadenopathy:       Head (right side): No submental, no submandibular, no tonsillar, no preauricular, no posterior auricular and no occipital adenopathy present.       Head (left side): No submental, no submandibular, no tonsillar, no preauricular, no posterior auricular and no occipital adenopathy present.    She has no cervical adenopathy.    She has no axillary adenopathy.       Right: No inguinal and no supraclavicular adenopathy present.       Left: No inguinal and no supraclavicular adenopathy present.  Neurological: She is alert and oriented to person, place, and time. She has normal strength and normal reflexes. She displays no atrophy. No cranial nerve deficit or sensory deficit. She  exhibits normal muscle tone. She displays a negative Romberg sign. Coordination and gait normal.  Skin: Skin is warm, dry and intact. No ecchymosis, no lesion and no rash noted. She is not diaphoretic. No cyanosis or erythema. No pallor. Nails show no clubbing.  Psychiatric: She has a normal mood and affect. Her speech is normal and behavior is normal. Judgment and thought content normal. Cognition and memory are normal.  Nursing note and vitals reviewed.      Assessment & Plan:  Medicare annual wellness visit, subsequent  Pure hypercholesterolemia - Continue current management pending results.  Plan: COMPLETE METABOLIC PANEL WITH GFR, Lipid panel  Screening for colorectal cancer - Plan: IFOBT POC (occult bld, rslt in office)

## 2014-11-17 NOTE — Patient Instructions (Signed)
Keeping You Healthy  Get These Tests  Blood Pressure- Have your blood pressure checked by your healthcare provider at least once a year.  Normal blood pressure is 120/80.  Weight- Have your body mass index (BMI) calculated to screen for obesity.  BMI is a measure of body fat based on height and weight.  You can calculate your own BMI at [employer].it  Cholesterol- Have your cholesterol checked every year.  Diabetes- Have your blood sugar checked every year if you have high blood pressure, high cholesterol, a family history of diabetes or if you are overweight.  Pap Test - Have a pap test every 1 to 5 years if you have been sexually active.  If you are older than 65 and recent pap tests have been normal you may not need additional pap tests.  In addition, if you have had a hysterectomy  for benign disease additional pap tests are not necessary.  Mammogram-Yearly mammograms are essential for early detection of breast cancer  Screening for Colon Cancer- Colonoscopy starting at age 20. Screening may begin sooner depending on your family history and other health conditions.  Follow up colonoscopy as directed by your Gastroenterologist.  Screening for Osteoporosis- Screening begins at age 32 with bone density scanning, sooner if you are at higher risk for developing Osteoporosis.  Get these medicines  Calcium with Vitamin D- Your body requires 1200-1500 mg of Calcium a day and [phone] IU of Vitamin D a day.  You can only absorb 500 mg of Calcium at a time therefore Calcium must be taken in 2 or 3 separate doses throughout the day.  Hormones- Hormone therapy has been associated with increased risk for certain cancers and heart disease.  Talk to your healthcare provider about if you need relief from menopausal symptoms.  Aspirin- Ask your healthcare provider about taking Aspirin to prevent Heart Disease and Stroke.  Get these Immunizations  Flu shot- Every fall  Pneumonia shot-  Once after the age of 34; if you are younger ask your healthcare provider if you need a pneumonia shot.  Tetanus- Every ten years. I will check your "paper chart" to see if Tetanus is documented there.  Zostavax- Once after the age of 65 to prevent shingles.  Take these steps  Don't smoke- Your healthcare provider can help you quit. For tips on how to quit, ask your healthcare provider or go to www.smokefree.gov or call 1-800 QUIT-NOW.  Be physically active- Exercise 5 days a week for a minimum of 30 minutes.  If you are not already physically active, start slow and gradually work up to 30 minutes of moderate physical activity.  Try walking, dancing, bike riding, swimming, etc.  Eat a healthy diet- Eat a variety of healthy foods such as fruits, vegetables, whole grains, low fat milk, low fat cheeses, yogurt, lean meats, chicken, fish, eggs, dried beans, tofu, etc.  For more information go to www.thenutritionsource.org  Dental visit- Brush and floss teeth twice daily; visit your dentist twice a year.  Eye exam- Visit your Optometrist or Ophthalmologist yearly.  Drink alcohol in moderation- Limit alcohol intake to one drink or less a day.  Never drink and drive.  Depression- Your emotional health is as important as your physical health.  If you're feeling down or losing interest in things you normally enjoy, please talk to your healthcare provider.  Seat Belts- can save your life; always wear one  Smoke/Carbon Monoxide detectors- These detectors need to be installed on the appropriate level of  your home.  Replace batteries at least once a year.  Violence- If anyone is threatening or hurting you, please tell your healthcare provider.  Living Will/ Health care power of attorney- Discuss with your healthcare provider and family.    FOR CHOLESTEROL REDUCTION-  Get the [facility] Advanced formula and take 1 daily. You can have Lipid panel rechecked in 6 months depending on results of today's  labs.  Take care and continue to live well!!!

## 2014-11-20 ENCOUNTER — Encounter: Payer: Self-pay | Admitting: Family Medicine

## 2014-11-29 LAB — IFOBT (OCCULT BLOOD): IMMUNOLOGICAL FECAL OCCULT BLOOD TEST: NEGATIVE

## 2015-02-06 ENCOUNTER — Emergency Department (HOSPITAL_COMMUNITY)
Admission: EM | Admit: 2015-02-06 | Discharge: 2015-02-07 | Disposition: A | Discharge status: Home / Self Care | Payer: Medicare HMO | Attending: Emergency Medicine | Admitting: Emergency Medicine

## 2015-02-06 ENCOUNTER — Encounter (HOSPITAL_COMMUNITY): Payer: Self-pay | Admitting: Emergency Medicine

## 2015-02-06 ENCOUNTER — Emergency Department (HOSPITAL_COMMUNITY): Payer: Medicare HMO

## 2015-02-06 DIAGNOSIS — K8020 Calculus of gallbladder without cholecystitis without obstruction: Secondary | ICD-10-CM | POA: Insufficient documentation

## 2015-02-06 DIAGNOSIS — Z9889 Other specified postprocedural states: Secondary | ICD-10-CM | POA: Insufficient documentation

## 2015-02-06 DIAGNOSIS — Z79899 Other long term (current) drug therapy: Secondary | ICD-10-CM | POA: Diagnosis not present

## 2015-02-06 DIAGNOSIS — M8580 Other specified disorders of bone density and structure, unspecified site: Secondary | ICD-10-CM | POA: Diagnosis not present

## 2015-02-06 DIAGNOSIS — M19049 Primary osteoarthritis, unspecified hand: Secondary | ICD-10-CM | POA: Insufficient documentation

## 2015-02-06 DIAGNOSIS — Z87891 Personal history of nicotine dependence: Secondary | ICD-10-CM | POA: Insufficient documentation

## 2015-02-06 DIAGNOSIS — Z85828 Personal history of other malignant neoplasm of skin: Secondary | ICD-10-CM | POA: Diagnosis not present

## 2015-02-06 DIAGNOSIS — K219 Gastro-esophageal reflux disease without esophagitis: Secondary | ICD-10-CM | POA: Insufficient documentation

## 2015-02-06 DIAGNOSIS — Z8669 Personal history of other diseases of the nervous system and sense organs: Secondary | ICD-10-CM | POA: Diagnosis not present

## 2015-02-06 DIAGNOSIS — M545 Low back pain, unspecified: Secondary | ICD-10-CM

## 2015-02-06 DIAGNOSIS — R109 Unspecified abdominal pain: Secondary | ICD-10-CM

## 2015-02-06 DIAGNOSIS — R112 Nausea with vomiting, unspecified: Secondary | ICD-10-CM

## 2015-02-06 DIAGNOSIS — R319 Hematuria, unspecified: Secondary | ICD-10-CM

## 2015-02-06 LAB — CBC WITH DIFFERENTIAL/PLATELET
BASOS ABS: 0 10*3/uL (ref 0.0–0.1)
BASOS PCT: 0 % (ref 0–1)
EOS PCT: 1 % (ref 0–5)
Eosinophils Absolute: 0.1 10*3/uL (ref 0.0–0.7)
HCT: 38.5 % (ref 36.0–46.0)
HEMOGLOBIN: 12.9 g/dL (ref 12.0–15.0)
Lymphocytes Relative: 13 % (ref 12–46)
Lymphs Abs: 1.1 10*3/uL (ref 0.7–4.0)
MCH: 29.1 pg (ref 26.0–34.0)
MCHC: 33.5 g/dL (ref 30.0–36.0)
MCV: 86.7 fL (ref 78.0–100.0)
Monocytes Absolute: 0.4 10*3/uL (ref 0.1–1.0)
Monocytes Relative: 5 % (ref 3–12)
NEUTROS PCT: 81 % — AB (ref 43–77)
Neutro Abs: 7 10*3/uL (ref 1.7–7.7)
PLATELETS: 212 10*3/uL (ref 150–400)
RBC: 4.44 MIL/uL (ref 3.87–5.11)
RDW: 13.2 % (ref 11.5–15.5)
WBC: 8.7 10*3/uL (ref 4.0–10.5)

## 2015-02-06 LAB — COMPREHENSIVE METABOLIC PANEL
ALBUMIN: 4 g/dL (ref 3.5–5.0)
ALK PHOS: 70 U/L (ref 38–126)
ALT: 18 U/L (ref 14–54)
AST: 21 U/L (ref 15–41)
Anion gap: 7 (ref 5–15)
BUN: 14 mg/dL (ref 6–20)
CO2: 28 mmol/L (ref 22–32)
CREATININE: 0.95 mg/dL (ref 0.44–1.00)
Calcium: 9.3 mg/dL (ref 8.9–10.3)
Chloride: 103 mmol/L (ref 101–111)
GFR calc Af Amer: >60 mL/min (ref >60)
GFR calc non Af Amer: >60 mL/min (ref >60)
GLUCOSE: 119 mg/dL — AB (ref 65–99)
Potassium: 3.6 mmol/L (ref 3.5–5.1)
SODIUM: 138 mmol/L (ref 135–145)
Total Bilirubin: 0.5 mg/dL (ref 0.3–1.2)
Total Protein: 6.8 g/dL (ref 6.5–8.1)

## 2015-02-06 LAB — URINE MICROSCOPIC-ADD ON

## 2015-02-06 LAB — URINALYSIS, ROUTINE W REFLEX MICROSCOPIC
Bilirubin Urine: NEGATIVE
Glucose, UA: NEGATIVE mg/dL
HGB URINE DIPSTICK: NEGATIVE
Ketones, ur: NEGATIVE mg/dL
Nitrite: NEGATIVE
Protein, ur: NEGATIVE mg/dL
SPECIFIC GRAVITY, URINE: 1.011 (ref 1.005–1.030)
UROBILINOGEN UA: 0.2 mg/dL (ref 0.0–1.0)
pH: 8.5 — ABNORMAL HIGH (ref 5.0–8.0)

## 2015-02-06 MED ORDER — SODIUM CHLORIDE 0.9 % IV BOLUS (SEPSIS)
1000.0000 mL | Freq: Once | INTRAVENOUS | Status: AC
  Administered 2015-02-06: 1000 mL via INTRAVENOUS

## 2015-02-06 MED ORDER — MORPHINE SULFATE (PF) 4 MG/ML IV SOLN
4.0000 mg | Freq: Once | INTRAVENOUS | Status: AC
  Administered 2015-02-06: 4 mg via INTRAVENOUS
  Filled 2015-02-06: qty 1

## 2015-02-06 MED ORDER — ONDANSETRON HCL 4 MG/2ML IJ SOLN
4.0000 mg | Freq: Once | INTRAMUSCULAR | Status: AC
  Administered 2015-02-06: 4 mg via INTRAVENOUS
  Filled 2015-02-06: qty 2

## 2015-02-06 NOTE — ED Provider Notes (Signed)
CSN: [id number]     Arrival date & time 02/06/15  2020 History   First MD Initiated Contact with Patient 02/06/15 2151     Chief Complaint  Patient presents with  . Flank Pain     (Consider location/radiation/quality/duration/timing/severity/associated sxs/prior Treatment) HPI Comments: Erin Cruz is a 69 y.o. female with a PMHx of asthma, GERD, osteopenia, and cataracts s/p repair, and a PSHx of L4-5 fusion surgery in Jan 2016 (Dr. Patrice Paradise), who presents to the ED with complaints of sudden onset right low back/flank pain that began around 6 PM. She reports the pain is 10/10 intermittent sharp and stabbing, nonradiating, worse with walking, and unrelieved with heat, ice, and ibuprofen. Associated symptoms include nausea with 2 episodes of nonbloody nonbilious emesis. States that earlier the pain made her feel lightheaded but this resolved. She denies any recent twisting or heavy lifting, fevers, chills, chest pain, shortness breath, abdominal pain, hematemesis, diarrhea, constipation, obstipation, dysuria, hematuria, urinary frequency, numbness, tingling, weakness, cauda equina symptoms, incontinence, or ongoing lightheadedness at this time. Denies any sick contacts, recent travel, suspicious food intake, alcohol use, or chronic NSAID use.  Patient is a 69 y.o. female presenting with flank pain. The history is provided by the patient. No language interpreter was used.  Flank Pain This is a new problem. The current episode started today. The problem occurs intermittently. The problem has been unchanged. Associated symptoms include nausea and vomiting (2x NBNB). Pertinent negatives include no abdominal pain, arthralgias, chest pain, chills, fever, myalgias, numbness, urinary symptoms or weakness. The symptoms are aggravated by walking. She has tried ice, heat and NSAIDs for the symptoms. The treatment provided no relief.    Past Medical History  Diagnosis Date  . Asthma     hand  . Cataract     . GERD (gastroesophageal reflux disease)   . Osteopenia   . 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 Osteopenia   . Early stage skin cancer    Past Surgical History  Procedure Laterality Date  . Cataract extraction w/ intraocular lens implant  2004    right  . Cystectomy  2008    benign  . Tubal ligation  1980  . Umbilical hernia repair  1979  . Hand surgery  1965 & 1970    ganglion cyst x 2  . Popliteal synovial cyst excision  1961    left knee  . Hernia repair    . Eye surgery    . Spine surgery  07/04/2014    L4 and L5 disc replacement and fusion L4-S1- Dr. Patrice Paradise  . Back surgery     Family History  Problem Relation Age of Onset  . Colon cancer Father   . Cancer Father   . Hyperlipidemia Father   . Colon cancer Maternal Grandmother   . Hypertension Sister    Social History  Substance Use Topics  . Smoking status: Former Research scientist (life sciences)  . Smokeless tobacco: Never Used  . Alcohol Use: 1.2 oz/week    2 Glasses of wine per week   OB History    No data available     Review of Systems  Constitutional: Negative for fever and chills.  Respiratory: Negative for shortness of breath.   Cardiovascular: Negative for chest pain.  Gastrointestinal: Positive for nausea and vomiting (2x NBNB). Negative for abdominal pain, diarrhea, constipation and blood in stool.  Genitourinary: Positive for flank pain. Negative for dysuria, frequency and hematuria.  Musculoskeletal: Positive for back pain (R low back).  Negative for myalgias and arthralgias.  Skin: Negative for color change.  Allergic/Immunologic: Negative for immunocompromised state.  Neurological: Negative for weakness, light-headedness (earlier due to pain, but resolved) and numbness.  Psychiatric/Behavioral: Negative for confusion.   10 Systems reviewed and are negative for acute change except as noted in the HPI.    Allergies  Statins  Home Medications   Prior to Admission medications   Medication Sig Start Date End Date  Taking? Authorizing Provider  calcium citrate-vitamin D (CITRACAL+D) 315-200 MG-UNIT per tablet Take 1 tablet by mouth 2 (two) times daily.    Historical Provider, MD  fish oil-omega-3 fatty acids 1000 MG capsule Take 1,200 mg by mouth daily.    Historical Provider, MD  glucosamine-chondroitin 500-400 MG tablet Take 1 tablet by mouth 3 (three) times daily.    Historical Provider, MD  Multiple Vitamins-Minerals (MULTIVITAMIN WITH MINERALS) tablet Take 1 tablet by mouth daily.    Historical Provider, MD  ranitidine (ZANTAC) 150 MG tablet Take 150 mg by mouth as needed.    Historical Provider, MD   BP 96/67 mmHg  Pulse 81  Temp(Src) 98.4 F (36.9 C) (Oral)  Resp 22  Ht 5\' 2"  (1.575 m)  Wt 130 lb (58.968 kg)  BMI 23.77 kg/m2  SpO2 100% Physical Exam  Constitutional: She is oriented to person, place, and time. Vital signs are normal. She appears well-developed and well-nourished.  Non-toxic appearance. She appears distressed.  Afebrile, nontoxic, uncomfortable appearing. BP in triage noted at 96/67, recheck during exam reveals 141/64.   HENT:  Head: Normocephalic and atraumatic.  Mouth/Throat: Oropharynx is clear and moist and mucous membranes are normal.  Eyes: Conjunctivae and EOM are normal. Right eye exhibits no discharge. Left eye exhibits no discharge.  Neck: Normal range of motion. Neck supple.  Cardiovascular: Normal rate, regular rhythm, normal heart sounds and intact distal pulses.  Exam reveals no gallop and no friction rub.   No murmur heard. Pulmonary/Chest: Effort normal and breath sounds normal. No respiratory distress. She has no decreased breath sounds. She has no wheezes. She has no rhonchi. She has no rales.  Abdominal: Soft. Normal appearance and bowel sounds are normal. She exhibits no distension. There is no tenderness. There is no rigidity, no rebound, no guarding, no CVA tenderness, no tenderness at McBurney's point and negative Murphy's sign.  Soft, NTND, +BS  throughout, no r/g/r, neg murphy's, neg mcburney's, no CVA TTP   Musculoskeletal: Normal range of motion.       Lumbar back: She exhibits tenderness. She exhibits normal range of motion, no bony tenderness, no deformity and no spasm.       Back:  Lumbar spine with FROM intact without spinous process TTP, no bony stepoffs or deformities, mild R sided paraspinous muscle TTP without muscle spasms. Strength 5/5 in all extremities, sensation grossly intact in all extremities, negative SLR bilaterally, gait steady and nonantalgic. No overlying skin changes.   Neurological: She is alert and oriented to person, place, and time. She has normal strength. No sensory deficit.  Skin: Skin is warm, dry and intact. No rash noted.  Psychiatric: She has a normal mood and affect.  Nursing note and vitals reviewed.   ED Course  Procedures (including critical care time) Labs Review Labs Reviewed  CBC WITH DIFFERENTIAL/PLATELET - Abnormal; Notable for the following:    Neutrophils Relative % 81 (*)    All other components within normal limits  COMPREHENSIVE METABOLIC PANEL - Abnormal; Notable for the following:  Glucose, Bld 119 (*)    All other components within normal limits  URINALYSIS, ROUTINE W REFLEX MICROSCOPIC (NOT AT [facility]) - Abnormal; Notable for the following:    pH 8.5 (*)    Leukocytes, UA SMALL (*)    All other components within normal limits  URINE CULTURE  URINE MICROSCOPIC-ADD ON    Imaging Review Ct Renal Stone Study  02/06/2015   CLINICAL DATA:  Right flank pain  EXAM: CT ABDOMEN AND PELVIS WITHOUT CONTRAST  TECHNIQUE: Multidetector CT imaging of the abdomen and pelvis was performed following the standard protocol without IV contrast.  COMPARISON:  None.  FINDINGS: BODY WALL: No contributory findings.  LOWER CHEST: No contributory findings.  ABDOMEN/PELVIS:  Liver: No focal abnormality.  Biliary: Cholelithiasis.  No indication of acute cholecystitis.  Pancreas: Unremarkable.  Spleen:  Unremarkable.  Adrenals: Unremarkable.  Kidneys and ureters: No hydronephrosis or stone.  Bladder: Unremarkable.  Reproductive: No pathologic findings.  Bowel: No obstruction. No appendicitis.  Retroperitoneum: No mass or adenopathy.  Peritoneum: No ascites or pneumoperitoneum.  Vascular: No acute abnormality.  Aortic and iliac atherosclerosis.  OSSEOUS: L4-5 and L5-S1 discectomy with posterior rod and pedicle screw fixation. Bony fusion is complete at X5-M8, less certain at U1-3. No evidence of hardware complication.  IMPRESSION: 1. No hydronephrosis or nephrolithiasis. 2. Cholelithiasis without evidence of cholecystitis.   Electronically Signed   By: Monte Fantasia M.D.   On: 02/06/2015 22:53   I, Camprubi-Soms, Patty Sermons, personally reviewed and evaluated these images and lab results as part of my medical decision-making.   EKG Interpretation None      MDM   Final diagnoses:  Right flank pain  Right-sided low back pain without sciatica  Hematuria  Non-intractable vomiting with nausea, vomiting of unspecified type  Calculus of gallbladder without cholecystitis without obstruction    69 y.o. female here with sudden onset R low back/flank pain associated with N/V. No hx of kidney issues. Hx of back surgery in January but states this feels different. On exam, no abd tenderness, no CVA tenderness, mild tenderness to R low back without paraspinous muscle spasms, no midline tenderness, no cauda equina symptoms. Neg SLR. Somewhat odd presentation for kidney stone but this is on the differential given sudden onset symptoms. Could be musculoskeletal but no recent trauma/lifting/twisting. Will obtain labs, U/A, and CT renal study. Will give fluids, nausea meds, and pain meds. Will reassess shortly.   12:20 AM Pain improved after 2nd dose of morphine, nausea resolved. Will PO challenge. U/A with small leuks, rare squamous, no nitrites, 7-10 WBC, 7-10 RBC, and rare bacteria. Not convincing for  infection but with some WBCs therefore will culture but will hold on tx given no sxs. CT neg for stone, which could mean it's a radiolucent stone or passed stone since she has hematuria, but could also mean this isn't the cause of her pain. CT also noting cholelithiasis without cholecystitis, neg murphy's on exam, doubt this is the cause. CMP WNL. Overall difficult to determine if she passed a stone vs radiolucent (rare) stone vs musculoskeletal back pain. Will give strainer in case she passes a stone, and discussed f/up with urology if this occurs. Otherwise, will PO challenge now and if she passes then will send home with pain and nausea meds and have her f/up with PCP in 1wk.   12:40 AM PO challenge passed. Will proceed with d/c with previously discussed plan. I explained the diagnosis and have given explicit precautions to return to the  ER including for any other new or worsening symptoms. The patient understands and accepts the medical plan as it's been dictated and I have answered their questions. Discharge instructions concerning home care and prescriptions have been given. The patient is STABLE and is discharged to home in good condition.  BP 135/56 mmHg  Pulse 69  Temp(Src) 98.4 F (36.9 C) (Oral)  Resp 16  Ht 5\' 2"  (1.575 m)  Wt 130 lb (58.968 kg)  BMI 23.77 kg/m2  SpO2 100%  Meds ordered this encounter  Medications  . sodium chloride 0.9 % bolus 1,000 mL    Sig:   . ondansetron (ZOFRAN) injection 4 mg    Sig:   . morphine 4 MG/ML injection 4 mg    Sig:   . morphine 4 MG/ML injection 4 mg    Sig:   . ondansetron (ZOFRAN) 8 MG tablet    Sig: Take 1 tablet (8 mg total) by mouth every 8 (eight) hours as needed for nausea or vomiting.    Dispense:  10 tablet    Refill:  0    Order Specific Question:  Supervising Provider    Answer:  Sabra Heck, BRIAN [[id number]]  . HYDROcodone-acetaminophen (NORCO) 5-325 MG per tablet    Sig: Take 1 tablet by mouth every 6 (six) hours as needed for  severe pain.    Dispense:  6 tablet    Refill:  0    Order Specific Question:  Supervising Provider    Answer:  MILLER, BRIAN [[id number]]  . naproxen (NAPROSYN) 500 MG tablet    Sig: Take 1 tablet (500 mg total) by mouth 2 (two) times daily as needed for mild pain, moderate pain or headache (TAKE WITH MEALS.).    Dispense:  20 tablet    Refill:  0    Order Specific Question:  Supervising Provider    Answer:  Noemi Chapel [[id number]]     Shawntae Lowy Camprubi-Soms, PA-C 02/07/15 0040  Merrily Pew, MD 02/07/15 0104

## 2015-02-06 NOTE — ED Notes (Signed)
Pt arrived to the ED with a complaint of back pain.  Pt states pain began at 1800 hrs.  Pt states she has no pain radiation.  Pt is nauseated.  Pt states she has become lightheaded with the pain.  Pt states pain is sharp and piercing.

## 2015-02-07 MED ORDER — NAPROXEN 500 MG PO TABS: 500.0000 mg | ORAL_TABLET | Freq: Two times a day (BID) | ORAL | Status: AC | PRN

## 2015-02-07 MED ORDER — ONDANSETRON HCL 8 MG PO TABS: 8.0000 mg | ORAL_TABLET | Freq: Three times a day (TID) | ORAL | Status: DC | PRN

## 2015-02-07 MED ORDER — HYDROCODONE-ACETAMINOPHEN 5-325 MG PO TABS: 1.0000 | ORAL_TABLET | Freq: Four times a day (QID) | ORAL | Status: DC | PRN

## 2015-02-07 MED ORDER — ONDANSETRON 8 MG PO TBDP
8.0000 mg | ORAL_TABLET | Freq: Once | ORAL | Status: AC
Start: 2015-02-07 — End: 2015-02-07
  Administered 2015-02-07: 8 mg via ORAL
  Filled 2015-02-07: qty 1

## 2015-02-07 NOTE — ED Notes (Signed)
Pt had another emesis episode prior to discharge. MD is aware.

## 2015-02-07 NOTE — Discharge Instructions (Signed)
Your pain could be from your back, or could be from kidney stones that aren't showing up on the CT scan we've performed. Take naprosyn as directed as needed for pain using norco for breakthrough pain. Do not drive or operate machinery with pain medication use. May need over-the-counter stool softener with this pain medication use. Use Zofran as needed for nausea. Your urine does not look like it's infected, but it will be cultured; if it comes back with bacterial infection then the lab will call you to tell you but they won't call if it's negative.  Strain all urine to see if you catch a kidney stone. Followup with urologist in the next 1 to 2 weeks if you pass a stone. Follow up with your regular doctor in 1 week for recheck of ongoing symptoms. Return to the emergency department for changes or worsening symptoms.     Flank Pain Flank pain is pain in your side. The flank is the area of your side between your upper belly (abdomen) and your back. Pain in this area can be caused by many different things. [place] care and treatment will depend on the cause of your pain.  Rest as told by your doctor.  Drink enough fluids to keep your pee (urine) clear or pale yellow.  Only take medicine as told by your doctor.  Tell your doctor about any changes in your pain.  Follow up with your doctor. GET HELP RIGHT AWAY IF:   Your pain does not get better with medicine.   You have new symptoms or your symptoms get worse.  Your pain gets worse.   You have belly (abdominal) pain.   You are short of breath.   You always feel sick to your stomach (nauseous).   You keep throwing up (vomiting).   You have puffiness (swelling) in your belly.   You feel light-headed or you pass out (faint).   You have blood in your pee.  You have a fever or lasting symptoms for more than 2-3 days.  You have a fever and your symptoms suddenly get worse. MAKE SURE YOU:   Understand these  instructions.  Will watch your condition.  Will get help right away if you are not doing well or get worse. Document Released: 03/19/2008 Document Revised: 10/25/2013 Document Reviewed: 01/23/2012 [facility] Patient Information 2015 [place], [place]. This information is not intended to replace advice given to you by your health care provider. Make sure you discuss any questions you have with your health care provider.  Nausea and Vomiting Nausea means you feel sick to your stomach. Throwing up (vomiting) is a reflex where stomach contents come out of your mouth. HOME CARE   Take medicine as told by your doctor.  Do not force yourself to eat. However, you do need to drink fluids.  If you feel like eating, eat a normal diet as told by your doctor.  Eat rice, wheat, potatoes, bread, lean meats, yogurt, fruits, and vegetables.  Avoid high-fat foods.  Drink enough fluids to keep your pee (urine) clear or pale yellow.  Ask your doctor how to replace body fluid losses (rehydrate). Signs of body fluid loss (dehydration) include:  Feeling very thirsty.  Dry lips and mouth.  Feeling dizzy.  Dark pee.  Peeing less than normal.  Feeling confused.  Fast breathing or heart rate. GET HELP RIGHT AWAY IF:   You have blood in your throw up.  You have black or bloody poop (stool).  You  have a bad headache or stiff neck.  You feel confused.  You have bad belly (abdominal) pain.  You have chest pain or trouble breathing.  You do not pee at least once every 8 hours.  You have cold, clammy skin.  You keep throwing up after 24 to 48 hours.  You have a fever. MAKE SURE YOU:   Understand these instructions.  Will watch your condition.  Will get help right away if you are not doing well or get worse. Document Released: 11/27/2007 Document Revised: 09/02/2011 Document Reviewed: 11/09/2010 [facility] Patient Information 2015 [place], [place]. This information is not intended to  replace advice given to you by your health care provider. Make sure you discuss any questions you have with your health care provider.

## 2015-02-09 ENCOUNTER — Ambulatory Visit (INDEPENDENT_AMBULATORY_CARE_PROVIDER_SITE_OTHER): Payer: Medicare HMO | Admitting: Family Medicine

## 2015-02-09 VITALS — BP 120/68 | HR 80 | Temp 97.8°F | Resp 18 | Ht 61.5 in | Wt 131.8 lb

## 2015-02-09 DIAGNOSIS — R1011 Right upper quadrant pain: Secondary | ICD-10-CM

## 2015-02-09 DIAGNOSIS — R109 Unspecified abdominal pain: Secondary | ICD-10-CM

## 2015-02-09 LAB — POCT URINALYSIS DIPSTICK
BILIRUBIN UA: NEGATIVE
Glucose, UA: NEGATIVE
Ketones, UA: NEGATIVE
Leukocytes, UA: NEGATIVE
Nitrite, UA: NEGATIVE
Protein, UA: NEGATIVE
RBC UA: NEGATIVE
Urobilinogen, UA: 0.2
pH, UA: 5.5

## 2015-02-09 LAB — URINE CULTURE

## 2015-02-09 LAB — POCT UA - MICROSCOPIC ONLY
Bacteria, U Microscopic: NEGATIVE
Crystals, Ur, HPF, POC: NEGATIVE
Epithelial cells, urine per micros: NEGATIVE
MUCUS UA: NEGATIVE
RBC, urine, microscopic: NEGATIVE
WBC, Ur, HPF, POC: NEGATIVE

## 2015-02-09 MED ORDER — PREDNISONE 20 MG PO TABS: ORAL_TABLET | ORAL | Status: DC

## 2015-02-09 MED ORDER — DICLOFENAC SODIUM 75 MG PO TBEC: DELAYED_RELEASE_TABLET | ORAL | Status: DC

## 2015-02-09 MED ORDER — TRAMADOL HCL 50 MG PO TABS: ORAL_TABLET | ORAL | Status: DC

## 2015-02-09 NOTE — Progress Notes (Signed)
  Subjective:  Patient ID: Erin Cruz, female    DOB: 1946/02/09  Age: 69 y.o. MRN: [id number]  69 year old lady with history of recent diagnosis of a kidney stone. She was in the emergency room and had a CT scan. No stone was seen. There was some microscopic blood in the urine, and they had her strain her urine. She continues to hurt badly over the last several days in the right flank. She has not done anything that injured herself that she knows of. No nausea or vomiting. No dysuria.   Objective:   Pleasant lady in no acute distress. Has had surgery on her low back and has the scar. No CVA tenderness. Seems to be tender right at the right SI joint region. No rashes were noted. Abdomen soft without masses or tenderness. Straight leg raise test negative. Good range of motion of her back.  Results for orders placed or performed in visit on 02/09/15  POCT UA - Microscopic Only  Result Value Ref Range   WBC, Ur, HPF, POC Negative    RBC, urine, microscopic Negative    Bacteria, U Microscopic Negative    Mucus, UA Negative    Epithelial cells, urine per micros Negative    Crystals, Ur, HPF, POC Negative    Casts, Ur, LPF, POC     Yeast, UA    POCT urinalysis dipstick  Result Value Ref Range   Color, UA Light Yellow    Clarity, UA Clear    Glucose, UA Negative    Bilirubin, UA Negative    Ketones, UA Negative    Spec Grav, UA <=1.005    Blood, UA Negative    pH, UA 5.5    Protein, UA Negative    Urobilinogen, UA 0.2    Nitrite, UA Negative    Leukocytes, UA Negative Negative     Assessment & Plan:   Assessment:   Right low back pain, moderately severe probably from SI joint region. I doubt kidney stones.  Plan:  Steroids and pain medications and see how she does.  There are no Patient Instructions on file for this visit.   Zyriah Mask, MD 02/09/2015

## 2015-02-09 NOTE — Patient Instructions (Signed)
Take the prednisone 3 pills daily for 2 days, then 2 daily for 2 days, then 1 daily for 2 days. Take with food, best taken early in the day.  Take diclofenac one twice daily for pain and inflammation  Take tramadol 1 every 6 or 8 hours only if needed for more severe pain. Actually tramadol works even better if you take one Tylenol with it.  You can use the ondansetron anytime if you need to 4 nausea. Any of the above medications can cause nausea, so take them with food.  Return or see her back doctor if the pain persists  If you should break out in the red and blistered rash is in the area of pain or around onto the low abdomen or pubic area please come in as soon as possible or contact me

## 2015-02-15 ENCOUNTER — Emergency Department (HOSPITAL_COMMUNITY)
Admission: EM | Admit: 2015-02-15 | Discharge: 2015-02-15 | Disposition: A | Discharge status: Home / Self Care | Payer: Medicare HMO | Attending: Emergency Medicine | Admitting: Emergency Medicine

## 2015-02-15 ENCOUNTER — Encounter (HOSPITAL_COMMUNITY): Payer: Self-pay | Admitting: Cardiology

## 2015-02-15 DIAGNOSIS — K219 Gastro-esophageal reflux disease without esophagitis: Secondary | ICD-10-CM | POA: Diagnosis not present

## 2015-02-15 DIAGNOSIS — M545 Low back pain, unspecified: Secondary | ICD-10-CM

## 2015-02-15 DIAGNOSIS — K5900 Constipation, unspecified: Secondary | ICD-10-CM | POA: Insufficient documentation

## 2015-02-15 DIAGNOSIS — Z8669 Personal history of other diseases of the nervous system and sense organs: Secondary | ICD-10-CM | POA: Diagnosis not present

## 2015-02-15 DIAGNOSIS — Z79899 Other long term (current) drug therapy: Secondary | ICD-10-CM | POA: Diagnosis not present

## 2015-02-15 DIAGNOSIS — Z85828 Personal history of other malignant neoplasm of skin: Secondary | ICD-10-CM | POA: Insufficient documentation

## 2015-02-15 DIAGNOSIS — J45909 Unspecified asthma, uncomplicated: Secondary | ICD-10-CM | POA: Insufficient documentation

## 2015-02-15 DIAGNOSIS — R55 Syncope and collapse: Secondary | ICD-10-CM | POA: Diagnosis not present

## 2015-02-15 DIAGNOSIS — M549 Dorsalgia, unspecified: Secondary | ICD-10-CM | POA: Diagnosis present

## 2015-02-15 DIAGNOSIS — Z87891 Personal history of nicotine dependence: Secondary | ICD-10-CM | POA: Diagnosis not present

## 2015-02-15 LAB — CBC WITH DIFFERENTIAL/PLATELET
Basophils Absolute: 0 10*3/uL (ref 0.0–0.1)
Basophils Relative: 0 % (ref 0–1)
EOS PCT: 0 % (ref 0–5)
Eosinophils Absolute: 0 10*3/uL (ref 0.0–0.7)
HEMATOCRIT: 44.2 % (ref 36.0–46.0)
Hemoglobin: 15.2 g/dL — ABNORMAL HIGH (ref 12.0–15.0)
Lymphocytes Relative: 9 % — ABNORMAL LOW (ref 12–46)
Lymphs Abs: 0.8 10*3/uL (ref 0.7–4.0)
MCH: 30.4 pg (ref 26.0–34.0)
MCHC: 34.4 g/dL (ref 30.0–36.0)
MCV: 88.4 fL (ref 78.0–100.0)
MONO ABS: 0.2 10*3/uL (ref 0.1–1.0)
MONOS PCT: 2 % — AB (ref 3–12)
Neutro Abs: 8.4 10*3/uL — ABNORMAL HIGH (ref 1.7–7.7)
Neutrophils Relative %: 89 % — ABNORMAL HIGH (ref 43–77)
Platelets: 247 10*3/uL (ref 150–400)
RBC: 5 MIL/uL (ref 3.87–5.11)
RDW: 13.5 % (ref 11.5–15.5)
WBC: 9.4 10*3/uL (ref 4.0–10.5)

## 2015-02-15 LAB — BASIC METABOLIC PANEL
Anion gap: 9 (ref 5–15)
BUN: 9 mg/dL (ref 6–20)
CALCIUM: 9.3 mg/dL (ref 8.9–10.3)
CO2: 29 mmol/L (ref 22–32)
CREATININE: 0.93 mg/dL (ref 0.44–1.00)
Chloride: 99 mmol/L — ABNORMAL LOW (ref 101–111)
GFR calc non Af Amer: >60 mL/min (ref >60)
Glucose, Bld: 116 mg/dL — ABNORMAL HIGH (ref 65–99)
Potassium: 4.1 mmol/L (ref 3.5–5.1)
SODIUM: 137 mmol/L (ref 135–145)

## 2015-02-15 MED ORDER — SODIUM CHLORIDE 0.9 % IV BOLUS (SEPSIS)
1000.0000 mL | Freq: Once | INTRAVENOUS | Status: AC
  Administered 2015-02-15: 1000 mL via INTRAVENOUS

## 2015-02-15 MED ORDER — HYDROMORPHONE HCL 1 MG/ML IJ SOLN: 0.5000 mg | Freq: Once | INTRAMUSCULAR | Status: DC

## 2015-02-15 MED ORDER — PROMETHAZINE HCL 25 MG RE SUPP: 25.0000 mg | Freq: Four times a day (QID) | RECTAL | Status: DC | PRN

## 2015-02-15 MED ORDER — ONDANSETRON HCL 4 MG/2ML IJ SOLN: 4.0000 mg | Freq: Once | INTRAMUSCULAR | Status: DC

## 2015-02-15 NOTE — ED Notes (Addendum)
Pt to department via EMS- pt reports she has been having lower back pain for the past week. Reports she was seen at the ER and worked up for a kidney stone. Cleared from the ED and then went to the spine specialist today given an injection and felt like she was going to pass out. Also reports some abd pain and nausea. Not orthostatic with EMS. Bp-136/[id number] Hr-60 [id number]

## 2015-02-15 NOTE — ED Provider Notes (Signed)
CSN: [id number]     Arrival date & time 02/15/15  1214 History   First MD Initiated Contact with Patient 02/15/15 1216     Chief Complaint  Patient presents with  . Back Pain  . Near Syncope     (Consider location/radiation/quality/duration/timing/severity/associated sxs/prior Treatment) Patient is a 69 y.o. female presenting with back pain. The history is provided by the patient.  Back Pain Location:  Lumbar spine Quality:  Shooting and stabbing Radiates to:  Does not radiate Pain severity:  Severe Pain is:  Same all the time Onset quality:  Gradual Duration:  2 weeks Timing:  Constant Progression:  Worsening Chronicity:  Recurrent Relieved by:  Nothing Worsened by:  Sitting, standing, movement and palpation Ineffective treatments:  None tried Associated symptoms: no abdominal pain, no chest pain, no dysuria, no fever and no headaches   Risk factors: no hx of cancer and no hx of osteoporosis    Patient is a 69 y.o. female who presents with right sided low back pain.  This started a few weeks ago, this is an ongoing problem.  Seen here in the ED about a week ago, CT stone study with no noted etiology of pain.  Followed up today with her spinal surgeon for a block.  Patient has had improvement with the block, but felt that the pain was so severe that she would pass out when she sat up.  Has not really eaten or drank in the past week due to persistent vomiting.  Thinks this is a reaction to her pain medicine so has stopped taking it.  Also has been constipated, states that she has tried multiple stool softners, miralax without relief.  Though on further questioning has not taken them feeling like they make her throw up. Denies fevers, chills.  Denies abdominal pain.  Pain localized to one area, no radiation, denies loss of bowel, bladder. Denies leg weakness. Pain worse with movement, palpation.   Past Medical History  Diagnosis Date  . Asthma     hand  . Cataract   . GERD  (gastroesophageal reflux disease)   . Osteopenia   . 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 Osteopenia   . Early stage skin cancer    Past Surgical History  Procedure Laterality Date  . Cataract extraction w/ intraocular lens implant  2004    right  . Cystectomy  2008    benign  . Tubal ligation  1980  . Umbilical hernia repair  1979  . Hand surgery  1965 & 1970    ganglion cyst x 2  . Popliteal synovial cyst excision  1961    left knee  . Hernia repair    . Eye surgery    . Spine surgery  07/04/2014    L4 and L5 disc replacement and fusion L4-S1- Dr. Patrice Paradise  . Back surgery     Family History  Problem Relation Age of Onset  . Colon cancer Father   . Cancer Father   . Hyperlipidemia Father   . Colon cancer Maternal Grandmother   . Hypertension Sister    Social History  Substance Use Topics  . Smoking status: Former Research scientist (life sciences)  . Smokeless tobacco: Never Used  . Alcohol Use: 1.2 oz/week    2 Glasses of wine per week   OB History    No data available     Review of Systems  Constitutional: Negative for fever and chills.  HENT: Negative for congestion and rhinorrhea.  Eyes: Negative for redness and visual disturbance.  Respiratory: Negative for shortness of breath and wheezing.   Cardiovascular: Negative for chest pain and palpitations.  Gastrointestinal: Positive for constipation. Negative for nausea, vomiting, abdominal pain and anal bleeding.  Genitourinary: Negative for dysuria and urgency.  Musculoskeletal: Positive for myalgias, back pain and arthralgias.  Skin: Negative for pallor and wound.  Neurological: Negative for dizziness and headaches.      Allergies  Norco; Morphine and related; and Statins  Home Medications   Prior to Admission medications   Medication Sig Start Date End Date Taking? Authorizing Provider  calcium citrate-vitamin D (CITRACAL+D) 315-200 MG-UNIT per tablet Take 1 tablet by mouth 2 (two) times daily.    Historical Provider, MD   diclofenac (VOLTAREN) 75 MG EC tablet Take 1 pill twice daily with food for pain and inflammations 02/09/15   Posey Boyer, MD  fish oil-omega-3 fatty acids 1000 MG capsule Take 1,200 mg by mouth daily.    Historical Provider, MD  glucosamine-chondroitin 500-400 MG tablet Take 1 tablet by mouth 3 (three) times daily.    Historical Provider, MD  HYDROcodone-acetaminophen (NORCO) 5-325 MG per tablet Take 1 tablet by mouth every 6 (six) hours as needed for severe pain. Patient not taking: Reported on 02/09/2015 02/07/15   Mercedes Camprubi-Soms, PA-C  KRILL OIL PO Take 1 capsule by mouth 2 (two) times daily.    Historical Provider, MD  Multiple Vitamins-Minerals (MULTIVITAMIN WITH MINERALS) tablet Take 1 tablet by mouth daily.    Historical Provider, MD  naproxen (NAPROSYN) 500 MG tablet Take 1 tablet (500 mg total) by mouth 2 (two) times daily as needed for mild pain, moderate pain or headache (TAKE WITH MEALS.). Patient not taking: Reported on 02/09/2015 02/07/15   Mercedes Camprubi-Soms, PA-C  ondansetron (ZOFRAN) 8 MG tablet Take 1 tablet (8 mg total) by mouth every 8 (eight) hours as needed for nausea or vomiting. Patient not taking: Reported on 02/09/2015 02/07/15   Mercedes Camprubi-Soms, PA-C  predniSONE (DELTASONE) 20 MG tablet Take 3 daily for 2 days, then 2 daily for 2 days, then 1 daily for 2 days 02/09/15   Posey Boyer, MD  promethazine (PHENERGAN) 25 MG suppository Place 1 suppository (25 mg total) rectally every 6 (six) hours as needed for nausea or vomiting. 02/15/15   Deno Etienne, DO  ranitidine (ZANTAC) 150 MG tablet Take 150 mg by mouth daily as needed for heartburn.     Historical Provider, MD  Red Yeast Rice Extract (RED YEAST RICE PO) Take 1 capsule by mouth daily.    Historical Provider, MD  traMADol (ULTRAM) 50 MG tablet Take one every 6 hours only if needed for moderate to severe pain 02/09/15   Posey Boyer, MD   BP 124/64 mmHg  Pulse 72  Temp(Src) 97.7 F (36.5 C) (Oral)   Resp 14  SpO2 99% Physical Exam  Constitutional: She is oriented to person, place, and time. She appears well-developed and well-nourished. No distress.  HENT:  Head: Normocephalic and atraumatic.  Eyes: EOM are normal. Pupils are equal, round, and reactive to light.  Neck: Normal range of motion. Neck supple.  Cardiovascular: Normal rate and regular rhythm.  Exam reveals no gallop and no friction rub.   No murmur heard. Pulmonary/Chest: Effort normal. She has no wheezes. She has no rales.  Abdominal: Soft. She exhibits no distension. There is no tenderness. There is no rebound and no guarding.  Musculoskeletal: She exhibits tenderness (ttp palpation to the right  SI joint area. Negative straight leg raise test bilaterally. Pulse motor and sensation intact distally). She exhibits no edema.  Neurological: She is alert and oriented to person, place, and time.  Skin: Skin is warm and dry. She is not diaphoretic.  Psychiatric: She has a normal mood and affect. Her behavior is normal.    ED Course  Procedures (including critical care time) Labs Review Labs Reviewed  CBC WITH DIFFERENTIAL/PLATELET - Abnormal; Notable for the following:    Hemoglobin 15.2 (*)    Neutrophils Relative % 89 (*)    Neutro Abs 8.4 (*)    Lymphocytes Relative 9 (*)    Monocytes Relative 2 (*)    All other components within normal limits  BASIC METABOLIC PANEL - Abnormal; Notable for the following:    Chloride 99 (*)    Glucose, Bld 116 (*)    All other components within normal limits    Imaging Review No results found. I have personally reviewed and evaluated these images and lab results as part of my medical decision-making.   EKG Interpretation None      MDM   Final diagnoses:  Right-sided low back pain without sciatica    69 yo F with what sounds like worsening of her chronic back pain.  Seen recently with negative CT scan for intraabdominal process, or new spinal fracture.  No kidney stone, no  dysuria.  Pain poorly controlled as patient wont take her home medications for fear they are worsening her nausea. No signs/symptoms of cauda equina. No recent injury feel imaging unnecessary at this time.  Basic lab evaluation unremarkable, patient refusing any intervention.  Discussed at length with patient about possible pain regiments, different anti-emetic possibilities.  Will trial phenergan suppositories, trial tylenol, motrin scheduled.  Miralax as stimulant as well as fleets enemas.     I have discussed the diagnosis/risks/treatment options with the patient and family and believe the pt to be eligible for discharge home to follow-up with PCP. We also discussed returning to the ED immediately if new or worsening sx occur. We discussed the sx which are most concerning (e.g., sudden worsening pain, cauda equina sx) that necessitate immediate return. Medications administered to the patient during their visit and any new prescriptions provided to the patient are listed below.  Medications given during this visit Medications  sodium chloride 0.9 % bolus 1,000 mL (0 mLs Intravenous Stopped 02/15/15 1439)    Discharge Medication List as of 02/15/2015  2:38 PM    START taking these medications   Details  promethazine (PHENERGAN) 25 MG suppository Place 1 suppository (25 mg total) rectally every 6 (six) hours as needed for nausea or vomiting., Starting 02/15/2015, Until Discontinued, Print         The patient appears reasonably screen and/or stabilized for discharge and I doubt any other medical condition or other [facility] requiring further screening, evaluation, or treatment in the ED at this time prior to discharge.     Deno Etienne, DO 02/15/15 2014

## 2015-02-23 ENCOUNTER — Other Ambulatory Visit: Payer: Self-pay | Admitting: Rehabilitation

## 2015-02-23 DIAGNOSIS — M5136 Other intervertebral disc degeneration, lumbar region: Secondary | ICD-10-CM

## 2015-04-01 DIAGNOSIS — Z23 Encounter for immunization: Secondary | ICD-10-CM | POA: Diagnosis not present

## 2015-04-03 ENCOUNTER — Ambulatory Visit (INDEPENDENT_AMBULATORY_CARE_PROVIDER_SITE_OTHER): Payer: Medicare HMO | Admitting: Physician Assistant

## 2015-04-03 ENCOUNTER — Encounter: Payer: Self-pay | Admitting: Physician Assistant

## 2015-04-03 VITALS — BP 105/68 | HR 77 | Temp 98.4°F | Resp 16 | Ht 61.25 in | Wt 131.8 lb

## 2015-04-03 DIAGNOSIS — R300 Dysuria: Secondary | ICD-10-CM | POA: Diagnosis not present

## 2015-04-03 DIAGNOSIS — N3001 Acute cystitis with hematuria: Secondary | ICD-10-CM

## 2015-04-03 LAB — POCT URINALYSIS DIP (MANUAL ENTRY)
Glucose, UA: NEGATIVE
NITRITE UA: NEGATIVE
PH UA: 5.5
Protein Ur, POC: 30 — AB
SPEC GRAV UA: 1.015
UROBILINOGEN UA: 0.2

## 2015-04-03 LAB — POC MICROSCOPIC URINALYSIS (UMFC): Mucus: ABSENT

## 2015-04-03 MED ORDER — NITROFURANTOIN MONOHYD MACRO 100 MG PO CAPS
100.0000 mg | ORAL_CAPSULE | Freq: Two times a day (BID) | ORAL | Status: DC
Start: 2015-04-03 — End: 2016-03-19

## 2015-04-03 NOTE — Progress Notes (Signed)
04/03/2015 at 5:40 PM  Erin Cruz / DOB: 1945-10-13 / MRN: [id number]  The patient has Pure hypercholesterolemia and DDD (degenerative disc disease), lumbar on her problem list.  SUBJECTIVE  Erin Cruz is a 69 y.o. well appearing female presenting for the chief complaint of dysuria and urgency that started today.  She denies frequency, hesitancy, nausea, and flank pain. Reports she took an OTC test that revealed elevated leukocytes and bacteria.   Last UTI was her first in January of 2016.    She  has a past medical history of Asthma; Cataract; GERD (gastroesophageal reflux disease); Osteopenia; Cancer ([place]) (1998); Osteopenia; and Early stage skin cancer.    Medications reviewed and updated by myself where necessary, and exist elsewhere in the encounter.   Erin Cruz is allergic to norco; morphine and related; and statins. She  reports that she has quit smoking. She has never used smokeless tobacco. She reports that she drinks about 1.2 oz of alcohol per week. She reports that she does not use illicit drugs. She  reports that she does not engage in sexual activity. The patient  has past surgical history that includes Cataract extraction w/ intraocular lens implant (2004); Cystectomy (2008); Tubal ligation (1980); Umbilical hernia repair (1979); Hand surgery ([place]); Popliteal synovial cyst excision (1961); Hernia repair; Eye surgery; Spine surgery (07/04/2014); and Back surgery.  Her family history includes Cancer in her father; Colon cancer in her father and maternal grandmother; Hyperlipidemia in her father; Hypertension in her sister.  Review of Systems  Constitutional: Negative for fever and chills.  Gastrointestinal: Negative for nausea.  Genitourinary: Positive for dysuria and urgency. Negative for frequency, hematuria and flank pain.  Musculoskeletal: Negative for myalgias.  Skin: Negative for rash.  Neurological: Negative for dizziness.    OBJECTIVE  Her   height is 5' 1.25" (1.556 m) and weight is 131 lb 12.8 oz (59.784 kg). Her oral temperature is 98.4 F (36.9 C). Her blood pressure is 105/68 and her pulse is 77. Her respiration is 16.  The patient's body mass index is 24.69 kg/(m^2).  Physical Exam  Vitals reviewed. Constitutional: She is oriented to person, place, and time.  Cardiovascular: Normal rate, regular rhythm and normal heart sounds.   Respiratory: Effort normal.  GI: Soft. Bowel sounds are normal. She exhibits no distension and no mass. There is no tenderness. There is no rebound, no guarding and no CVA tenderness.  Neurological: She is alert and oriented to person, place, and time.  Skin: Skin is warm and dry.    Results for orders placed or performed in visit on 04/03/15 (from the past 24 hour(s))  POCT urinalysis dipstick     Status: Abnormal   Collection Time: 04/03/15  5:21 PM  Result Value Ref Range   Color, UA other (A) yellow   Clarity, UA hazy (A) clear   Glucose, UA negative negative   Bilirubin, UA small (A) negative   Ketones, POC UA small (15) (A) negative   Spec Grav, UA 1.015    Blood, UA moderate (A) negative   pH, UA 5.5    Protein Ur, POC =30 (A) negative   Urobilinogen, UA 0.2    Nitrite, UA Negative Negative   Leukocytes, UA large (3+) (A) Negative  POCT Microscopic Urinalysis ([facility])     Status: Abnormal   Collection Time: 04/03/15  5:22 PM  Result Value Ref Range   WBC,UR,HPF,POC Moderate (A) None WBC/hpf   RBC,UR,HPF,POC Moderate (A) None RBC/hpf  Bacteria Moderate (A) None   Mucus Absent Absent   Epithelial Cells, UR Per Microscopy Moderate (A) None cells/hpf    ASSESSMENT & PLAN  Erin Cruz was seen today for urinary tract infection.  Diagnoses and all orders for this visit:  Dysuria -     POCT urinalysis dipstick -     POCT Microscopic Urinalysis ([facility]) -     Urine culture  Acute cystitis with hematuria -     nitrofurantoin, macrocrystal-monohydrate, (MACROBID) 100 MG capsule;  Take 1 capsule (100 mg total) by mouth 2 (two) times daily.    The patient was advised to call or come back to clinic if she does not see an improvement in symptoms, or worsens with the above plan.   Philis Fendt, MHS, PA-C [facility] 04/03/2015 5:40 PM

## 2015-04-05 LAB — URINE CULTURE

## 2015-07-21 DIAGNOSIS — Z6824 Body mass index (BMI) 24.0-24.9, adult: Secondary | ICD-10-CM | POA: Diagnosis not present

## 2015-10-10 DIAGNOSIS — R69 Illness, unspecified: Secondary | ICD-10-CM | POA: Diagnosis not present

## 2015-10-13 ENCOUNTER — Other Ambulatory Visit: Payer: Self-pay

## 2015-12-20 DIAGNOSIS — Z23 Encounter for immunization: Secondary | ICD-10-CM | POA: Diagnosis not present

## 2016-01-09 ENCOUNTER — Telehealth: Payer: Self-pay

## 2016-01-09 DIAGNOSIS — Z1231 Encounter for screening mammogram for malignant neoplasm of breast: Secondary | ICD-10-CM

## 2016-01-09 NOTE — Telephone Encounter (Signed)
Attempted to call pt, left VM for pt to call back asap  

## 2016-01-09 NOTE — Telephone Encounter (Signed)
Orders Placed This Encounter  Procedures  . MM Digital Screening    Standing Status: Future     Number of Occurrences:      Standing Expiration Date: 03/11/2017    Order Specific Question:  Reason for Exam (SYMPTOM  OR DIAGNOSIS REQUIRED)    Answer:  screening for breast cancer    Order Specific Question:  Preferred imaging location?    Answer:  [facility]

## 2016-01-09 NOTE — Telephone Encounter (Signed)
The patient is requesting orders for a routine mammogram for [facility].  She is a former Dr [name] patient.  She is planning to establish with [facility] in September, but is hoping that she could get her mammogram completed before she establishes with [facility].  Please advise.  Patient CB#: [phone]

## 2016-01-10 DIAGNOSIS — H04123 Dry eye syndrome of bilateral lacrimal glands: Secondary | ICD-10-CM | POA: Diagnosis not present

## 2016-01-10 NOTE — Telephone Encounter (Signed)
Pt.notified

## 2016-03-13 ENCOUNTER — Ambulatory Visit: Payer: Medicare HMO

## 2016-03-19 ENCOUNTER — Encounter: Payer: Self-pay | Admitting: Physician Assistant

## 2016-03-19 ENCOUNTER — Ambulatory Visit (INDEPENDENT_AMBULATORY_CARE_PROVIDER_SITE_OTHER): Payer: Medicare HMO | Admitting: Physician Assistant

## 2016-03-19 VITALS — BP 116/70 | HR 84 | Temp 98.6°F | Resp 18 | Ht 61.25 in | Wt 135.0 lb

## 2016-03-19 DIAGNOSIS — Z139 Encounter for screening, unspecified: Secondary | ICD-10-CM

## 2016-03-19 DIAGNOSIS — K219 Gastro-esophageal reflux disease without esophagitis: Secondary | ICD-10-CM | POA: Insufficient documentation

## 2016-03-19 DIAGNOSIS — Z1231 Encounter for screening mammogram for malignant neoplasm of breast: Secondary | ICD-10-CM

## 2016-03-19 DIAGNOSIS — Z130 Encounter for screening for diseases of the blood and blood-forming organs and certain disorders involving the immune mechanism: Secondary | ICD-10-CM

## 2016-03-19 DIAGNOSIS — E785 Hyperlipidemia, unspecified: Secondary | ICD-10-CM

## 2016-03-19 DIAGNOSIS — Z23 Encounter for immunization: Secondary | ICD-10-CM | POA: Diagnosis not present

## 2016-03-19 DIAGNOSIS — Z13228 Encounter for screening for other metabolic disorders: Secondary | ICD-10-CM

## 2016-03-19 DIAGNOSIS — M8580 Other specified disorders of bone density and structure, unspecified site: Secondary | ICD-10-CM | POA: Insufficient documentation

## 2016-03-19 DIAGNOSIS — Z0000 Encounter for general adult medical examination without abnormal findings: Secondary | ICD-10-CM

## 2016-03-19 DIAGNOSIS — Z1389 Encounter for screening for other disorder: Secondary | ICD-10-CM

## 2016-03-19 DIAGNOSIS — Z1329 Encounter for screening for other suspected endocrine disorder: Secondary | ICD-10-CM | POA: Diagnosis not present

## 2016-03-19 DIAGNOSIS — H9191 Unspecified hearing loss, right ear: Secondary | ICD-10-CM | POA: Insufficient documentation

## 2016-03-19 LAB — CBC WITH DIFFERENTIAL/PLATELET
BASOS PCT: 1 %
Basophils Absolute: 57 cells/uL (ref 0–200)
EOS ABS: 171 {cells}/uL (ref 15–500)
Eosinophils Relative: 3 %
HCT: 42.7 % (ref 35.0–45.0)
Hemoglobin: 14.3 g/dL (ref 11.7–15.5)
LYMPHS PCT: 29 %
Lymphs Abs: 1653 cells/uL (ref 850–3900)
MCH: 29.6 pg (ref 27.0–33.0)
MCHC: 33.5 g/dL (ref 32.0–36.0)
MCV: 88.4 fL (ref 80.0–100.0)
MONO ABS: 570 {cells}/uL (ref 200–950)
MONOS PCT: 10 %
MPV: 8.3 fL (ref 7.5–12.5)
NEUTROS ABS: 3249 {cells}/uL (ref 1500–7800)
Neutrophils Relative %: 57 %
PLATELETS: 241 10*3/uL (ref 140–400)
RBC: 4.83 MIL/uL (ref 3.80–5.10)
RDW: 14.3 % (ref 11.0–15.0)
WBC: 5.7 10*3/uL (ref 3.8–10.8)

## 2016-03-19 LAB — POCT URINALYSIS DIP (MANUAL ENTRY)
BILIRUBIN UA: NEGATIVE
Bilirubin, UA: NEGATIVE
Blood, UA: NEGATIVE
Glucose, UA: NEGATIVE
LEUKOCYTES UA: NEGATIVE
NITRITE UA: NEGATIVE
PH UA: 6
PROTEIN UA: NEGATIVE
Spec Grav, UA: 1.015
UROBILINOGEN UA: 0.2

## 2016-03-19 LAB — COMPREHENSIVE METABOLIC PANEL
ALK PHOS: 74 U/L (ref 33–130)
ALT: 14 U/L (ref 6–29)
AST: 21 U/L (ref 10–35)
Albumin: 4.3 g/dL (ref 3.6–5.1)
BILIRUBIN TOTAL: 0.7 mg/dL (ref 0.2–1.2)
BUN: 14 mg/dL (ref 7–25)
CO2: 25 mmol/L (ref 20–31)
CREATININE: 1 mg/dL — AB (ref 0.50–0.99)
Calcium: 9.2 mg/dL (ref 8.6–10.4)
Chloride: 103 mmol/L (ref 98–110)
GLUCOSE: 86 mg/dL (ref 65–99)
Potassium: 4.3 mmol/L (ref 3.5–5.3)
Sodium: 140 mmol/L (ref 135–146)
TOTAL PROTEIN: 7 g/dL (ref 6.1–8.1)

## 2016-03-19 LAB — LIPID PANEL
CHOLESTEROL: 282 mg/dL — AB (ref 125–200)
HDL: 45 mg/dL — ABNORMAL LOW (ref >=46)
LDL Cholesterol: 216 mg/dL — ABNORMAL HIGH (ref <130)
Total CHOL/HDL Ratio: 6.3 Ratio — ABNORMAL HIGH (ref <=5.0)
Triglycerides: 107 mg/dL (ref <150)
VLDL: 21 mg/dL (ref <30)

## 2016-03-19 LAB — TSH: TSH: 1.82 m[IU]/L

## 2016-03-19 NOTE — Progress Notes (Signed)
Presents today for [employer] Visit-Subsequent.   Date of last exam: May 2016  Interpreter used for this visit? no  Patient Care Team: Harrison Mons, PA-C as PCP - General (Family Medicine) Starling Manns, MD (Orthopedic Surgery)     Other items to address today: none   Cancer Screening: Cervical: yes, 10/05/2013, normal cytology; no history of abnormal pap. Breast: yes, mammogram 11/02/2014, normal; scheduled already Colon: yes, 11/27/2011, Q5 years due to family history of colon cancer  Prostate: n/a   Other Screening: Last screening for diabetes: 02/15/2015, not fasting 116 Last lipid screening: 11/17/2014   ADVANCE DIRECTIVES: Discussed: yes On File: no, requested Materials Provided: n/a   Immunization status: up to date and documented.  Home Environment: lives alone.    Patient Active Problem List   Diagnosis Date Noted  . Deafness in right ear 03/19/2016  . Osteopenia   . GERD (gastroesophageal reflux disease)   . DDD (degenerative disc disease), lumbar 10/05/2013  . Pure hypercholesterolemia 04/08/2013  . Cancer ([place]) 06/24/1996     Past Medical History:  Diagnosis Date  . Asthma    hand  . 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 Cataract   . GERD (gastroesophageal reflux disease)   . Osteopenia      Past Surgical History:  Procedure Laterality Date  . CATARACT EXTRACTION W/ INTRAOCULAR LENS IMPLANT  2004   right  . CYSTECTOMY  2008   benign  . EYE SURGERY    . HAND SURGERY  1965 & 1970   ganglion cyst x 2  . POPLITEAL SYNOVIAL CYST EXCISION  1961   left knee  . SPINE SURGERY  07/04/2014   L4 and L5 disc replacement and fusion L4-S1- Dr. Patrice Paradise  . TUBAL LIGATION  1980  . UMBILICAL HERNIA REPAIR  1979     Family History  Problem Relation Age of Onset  . Colon cancer Father   . Hyperlipidemia Father   . Leukemia Father     due to treatment for colon cancer  . Colon cancer Maternal Grandmother   . Hypertension Sister       Social History   Social History  . Marital status: Single    Spouse name: n/a  . Number of children: 2  . Years of education: Associate's Degree   Occupational History  . retired     Scientist, product/process development   Social History Main Topics  . Smoking status: Former Research scientist (life sciences)  . Smokeless tobacco: Never Used  . Alcohol use 1.2 oz/week    2 Glasses of wine per week  . Drug use: No  . Sexual activity: No   Other Topics Concern  . Not on file   Social History Narrative   Exercise walking 5 days/week for 30 minutes.   Lives alone.   Daughter lives in [place].   Son lives in [place].     Allergies  Allergen Reactions  . Norco [Hydrocodone-Acetaminophen] Nausea And Vomiting  . Morphine And Related Other (See Comments)    Patient stated that she can take this but she has to take it with a nausea medication  . Statins Other (See Comments)    myalgias     Prior to Admission medications   Medication Sig Start Date End Date Taking? Authorizing Provider  calcium citrate-vitamin D (CITRACAL+D) 315-200 MG-UNIT per tablet Take 1 tablet by mouth 2 (two) times daily.   Yes Historical Provider, MD  glucosamine-chondroitin 500-400 MG tablet Take 1 tablet by mouth  3 (three) times daily.   Yes Historical Provider, MD  naproxen (NAPROSYN) 500 MG tablet Take 1 tablet (500 mg total) by mouth 2 (two) times daily as needed for mild pain, moderate pain or headache (TAKE WITH MEALS.). 02/07/15  Yes Mercedes Camprubi-Soms, PA-C  ranitidine (ZANTAC) 150 MG tablet Take 150 mg by mouth daily as needed for heartburn.    Yes Historical Provider, MD  Red Yeast Rice Extract (RED YEAST RICE PO) Take 1 capsule by mouth daily.   Yes Historical Provider, MD  diclofenac (VOLTAREN) 75 MG EC tablet Take 1 pill twice daily with food for pain and inflammations Patient not taking: Reported on 03/19/2016 02/09/15   Posey Boyer, MD  fish oil-omega-3 fatty acids 1000 MG capsule Take 1,200 mg by mouth daily.    Historical  Provider, MD  HYDROcodone-acetaminophen (NORCO) 5-325 MG per tablet Take 1 tablet by mouth every 6 (six) hours as needed for severe pain. Patient not taking: Reported on 03/19/2016 02/07/15   Mercedes Camprubi-Soms, PA-C  KRILL OIL PO Take 1 capsule by mouth 2 (two) times daily.    Historical Provider, MD  Multiple Vitamins-Minerals (MULTIVITAMIN WITH MINERALS) tablet Take 1 tablet by mouth daily.    Historical Provider, MD  promethazine (PHENERGAN) 25 MG suppository Place 1 suppository (25 mg total) rectally every 6 (six) hours as needed for nausea or vomiting. Patient not taking: Reported on 03/19/2016 02/15/15   Deno Etienne, DO  traMADol Veatrice Bourbon) 50 MG tablet Take one every 6 hours only if needed for moderate to severe pain Patient not taking: Reported on 03/19/2016 02/09/15   Posey Boyer, MD     Depression screen [facility] 2/9 03/19/2016 04/03/2015 02/09/2015 11/17/2014 10/05/2013  Decreased Interest 0 0 0 0 0  Down, Depressed, Hopeless 0 0 0 0 0  PHQ - 2 Score 0 0 0 0 0     Fall Risk  03/19/2016 04/03/2015 11/17/2014 10/05/2013  Falls in the past year? No No No No     Functional Status Survey: Is the patient deaf or have difficulty hearing?: Yes Does the patient have difficulty seeing, even when wearing glasses/contacts?: No Does the patient have difficulty concentrating, remembering, or making decisions?: No Does the patient have difficulty walking or climbing stairs?: No Does the patient have difficulty dressing or bathing?: No Does the patient have difficulty doing errands alone such as visiting a doctor's office or shopping?: No     Amb Nursing Assessment - 03/19/16 0916      Functional Status   Activities of Daily Living Independent   Ambulation Independent   Medication Administration Independent   Home Management Independent     Risk/Barriers  Assessment   Barriers to Care Management & Learning None     Abuse/Neglect Assessment   Do you feel unsafe in your current relationship?  No   Do you feel physically threatened by others? No   Anyone hurting you at home, work, or school? No   Unable to ask? No   Information provided on Community resources No     Patient Literacy   How often do you need to have someone help you when you read instructions, pamphlets, or other written materials from your doctor or pharmacy? 1 - Never   What is the last grade level you completed in school? Associate's Degree     Language Assistant   Interpreter Needed? No       Depression screen [facility] 2/9 03/19/2016 04/03/2015 02/09/2015 11/17/2014 10/05/2013  Decreased Interest  0 0 0 0 0  Down, Depressed, Hopeless 0 0 0 0 0  PHQ - 2 Score 0 0 0 0 0    Evaluation of Cognitive Function: Mood/affect: cheerful, bright Appearance: well-groomed Family Member/caregiver input:      PHYSICAL EXAM: BP 116/70 (BP Location: Right Arm, Patient Position: Sitting, Cuff Size: Small)   Pulse 84   Temp 98.6 F (37 C) (Oral)   Resp 18   Ht 5' 1.25" (1.556 m)   Wt 135 lb (61.2 kg)   SpO2 94%   BMI 25.30 kg/m    Wt Readings from Last 3 Encounters:  03/19/16 135 lb (61.2 kg)  04/03/15 131 lb 12.8 oz (59.8 kg)  02/09/15 131 lb 12.8 oz (59.8 kg)      No exam data present    Physical Exam  Constitutional: She is oriented to person, place, and time. She appears well-developed and well-nourished. She is active and cooperative. No distress.  HENT:  Head: Normocephalic and atraumatic.  Right Ear: Hearing, tympanic membrane, external ear and ear canal normal.  Left Ear: Hearing, tympanic membrane, external ear and ear canal normal.  Nose: Nose normal.  Mouth/Throat: Uvula is midline, oropharynx is clear and moist and mucous membranes are normal. No oral lesions. No uvula swelling. No oropharyngeal exudate.  Eyes: Conjunctivae, EOM and lids are normal. Pupils are equal, round, and reactive to light. Right eye exhibits no discharge. Left eye exhibits no discharge. No scleral icterus.   Fundoscopic exam:      The right eye shows no hemorrhage and no papilledema. The right eye shows red reflex.       The left eye shows no hemorrhage and no papilledema. The left eye shows red reflex.  Neck: Normal range of motion, full passive range of motion without pain and phonation normal. Neck supple. No thyromegaly present.  Cardiovascular: Normal rate, regular rhythm, normal heart sounds and intact distal pulses.  Exam reveals no gallop and no friction rub.   No murmur heard. Respiratory: Effort normal and breath sounds normal. Right breast exhibits no inverted nipple, no mass, no nipple discharge, no skin change and no tenderness. Left breast exhibits no inverted nipple, no mass, no nipple discharge, no skin change and no tenderness. Breasts are symmetrical.  GI: Soft. Normal appearance and bowel sounds are normal. There is no hepatosplenomegaly. There is no tenderness.  Musculoskeletal:       Cervical back: Normal.       Thoracic back: Normal.       Lumbar back: Normal.  Lymphadenopathy:       Head (right side): No submandibular and no tonsillar adenopathy present.       Head (left side): No submandibular and no tonsillar adenopathy present.    She has no cervical adenopathy.       Right: No supraclavicular adenopathy present.       Left: No supraclavicular adenopathy present.  Neurological: She is alert and oriented to person, place, and time. She has normal strength. No cranial nerve deficit or sensory deficit.  Skin: Skin is warm and dry. No rash noted. She is not diaphoretic.  Psychiatric: She has a normal mood and affect. Her speech is normal and behavior is normal. Judgment and thought content normal. Cognition and memory are normal.       Education/Counseling: yes diet and exercise yes prevention of chronic diseases yes smoking/tobacco cessation yes review "Covered Medicare Preventive Services"    ASSESSMENT/PLAN:   1. Medicare annual wellness visit,  subsequent 2. Annual physical exam Age appropriate anticipatory guidance provided.  3. Need for influenza vaccination - Flu Vaccine QUAD 36+ mos IM  4. Encounter for screening mammogram for breast cancer Patient has already scheduled this   5. Osteopenia - DG Bone Density; Future  6. Hyperlipidemia Await labs. Adjust regimen as indicated by results. - Lipid panel  7. Screening for deficiency anemia - CBC with Differential/Platelet  8. Screening for blood or protein in urine - POCT urinalysis dipstick  9. Screening for thyroid disorder - TSH  10. Screening for metabolic disorder - Comprehensive metabolic panel   Fara Chute, PA-C Physician Assistant-Certified [facility]

## 2016-03-19 NOTE — Patient Instructions (Signed)
     IF you received an x-ray today, you will receive an invoice from [facility]. Please contact [facility] at [phone] with questions or concerns regarding your invoice.   IF you received labwork today, you will receive an invoice from [facility]. Please contact [facility] at [phone] with questions or concerns regarding your invoice.   Our billing staff will not be able to assist you with questions regarding bills from these companies.  You will be contacted with the lab results as soon as they are available. The fastest way to get your results is to activate your My Chart account. Instructions are located on the last page of this paperwork. If you have not heard from us regarding the results in 2 weeks, please contact this office.      

## 2016-03-26 ENCOUNTER — Ambulatory Visit: Payer: Medicare HMO

## 2016-04-03 ENCOUNTER — Ambulatory Visit
Admission: RE | Admit: 2016-04-03 | Discharge: 2016-04-03 | Disposition: A | Discharge status: Home / Self Care | Payer: Medicare HMO | Source: Ambulatory Visit | Attending: Physician Assistant | Admitting: Physician Assistant

## 2016-04-03 DIAGNOSIS — M8589 Other specified disorders of bone density and structure, multiple sites: Secondary | ICD-10-CM | POA: Diagnosis not present

## 2016-04-03 DIAGNOSIS — Z780 Asymptomatic menopausal state: Secondary | ICD-10-CM | POA: Diagnosis not present

## 2016-04-03 DIAGNOSIS — Z1231 Encounter for screening mammogram for malignant neoplasm of breast: Secondary | ICD-10-CM | POA: Diagnosis not present

## 2016-04-03 DIAGNOSIS — M8580 Other specified disorders of bone density and structure, unspecified site: Secondary | ICD-10-CM

## 2016-04-08 ENCOUNTER — Other Ambulatory Visit: Payer: Self-pay | Admitting: Physician Assistant

## 2016-04-08 ENCOUNTER — Telehealth: Payer: Self-pay

## 2016-04-08 DIAGNOSIS — R928 Other abnormal and inconclusive findings on diagnostic imaging of breast: Secondary | ICD-10-CM

## 2016-04-08 NOTE — Telephone Encounter (Signed)
PT was given options on medication for high cholesterol per chelle and wants to discuss which one to take   Her contact number is [phone]

## 2016-04-12 ENCOUNTER — Telehealth: Payer: Self-pay | Admitting: Emergency Medicine

## 2016-04-12 ENCOUNTER — Ambulatory Visit
Admission: RE | Admit: 2016-04-12 | Discharge: 2016-04-12 | Disposition: A | Discharge status: Home / Self Care | Payer: Medicare HMO | Source: Ambulatory Visit | Attending: Physician Assistant | Admitting: Physician Assistant

## 2016-04-12 DIAGNOSIS — R922 Inconclusive mammogram: Secondary | ICD-10-CM | POA: Diagnosis not present

## 2016-04-12 DIAGNOSIS — R928 Other abnormal and inconclusive findings on diagnostic imaging of breast: Secondary | ICD-10-CM

## 2016-04-12 NOTE — Telephone Encounter (Signed)
Pt given options per Chelle and explained side effects. States, she will find out what med Medicare will cover and let us know.  She also wants Chelle to know she replaced  Krill oil 1,000mg  instead of the fish oil.

## 2016-04-12 NOTE — Telephone Encounter (Signed)
I recommend a statin to start. That is the most potent and also reduces the risk of a cardiovascular event beyond it's ability to lower cholesterol. Atorvastatin 20 mg 1 PO QPM #90, RF x 3.  There are adverse effects, muscle aches being the most common. For those who don't tolerate the statin, I recommend considering Niaspan next. The most common side effect is flushing, which is expected for the initial 3-4 days and then typically subsides.  Taking a daily aspirin and taking the dose with apple sauce usually helps.  Then, I'd consider zetia.

## 2016-05-01 ENCOUNTER — Other Ambulatory Visit: Payer: Self-pay | Admitting: Emergency Medicine

## 2016-05-01 MED ORDER — EZETIMIBE 10 MG PO TABS: 10.0000 mg | ORAL_TABLET | Freq: Every day | ORAL | 3 refills | Status: DC

## 2016-05-01 NOTE — Progress Notes (Unsigned)
Pt called in requesting generic brand for Zetia due to cost.  Generic script e-scribed to new pharmacy preference [facility]

## 2016-08-20 DIAGNOSIS — M25712 Osteophyte, left shoulder: Secondary | ICD-10-CM | POA: Diagnosis not present

## 2016-08-20 DIAGNOSIS — M25512 Pain in left shoulder: Secondary | ICD-10-CM | POA: Diagnosis not present

## 2016-08-20 DIAGNOSIS — M19012 Primary osteoarthritis, left shoulder: Secondary | ICD-10-CM | POA: Diagnosis not present

## 2016-08-20 DIAGNOSIS — M7542 Impingement syndrome of left shoulder: Secondary | ICD-10-CM | POA: Diagnosis not present

## 2016-08-29 DIAGNOSIS — L814 Other melanin hyperpigmentation: Secondary | ICD-10-CM | POA: Diagnosis not present

## 2016-08-29 DIAGNOSIS — L821 Other seborrheic keratosis: Secondary | ICD-10-CM | POA: Diagnosis not present

## 2016-09-17 ENCOUNTER — Encounter: Payer: Self-pay | Admitting: Physician Assistant

## 2016-09-17 ENCOUNTER — Ambulatory Visit (INDEPENDENT_AMBULATORY_CARE_PROVIDER_SITE_OTHER): Payer: Medicare HMO | Admitting: Physician Assistant

## 2016-09-17 VITALS — BP 123/76 | HR 73 | Temp 97.6°F | Resp 16 | Ht 61.0 in | Wt 136.0 lb

## 2016-09-17 DIAGNOSIS — R12 Heartburn: Secondary | ICD-10-CM | POA: Diagnosis not present

## 2016-09-17 DIAGNOSIS — Z8739 Personal history of other diseases of the musculoskeletal system and connective tissue: Secondary | ICD-10-CM

## 2016-09-17 DIAGNOSIS — M545 Low back pain, unspecified: Secondary | ICD-10-CM

## 2016-09-17 MED ORDER — PREDNISONE 10 MG PO TABS: ORAL_TABLET | ORAL | 0 refills | Status: AC

## 2016-09-17 MED ORDER — RANITIDINE HCL 150 MG PO TABS: 150.0000 mg | ORAL_TABLET | Freq: Two times a day (BID) | ORAL | 4 refills | Status: DC

## 2016-09-17 NOTE — Patient Instructions (Addendum)
  Try and increase your Zantac to 2x/day dosing or 2 pills once a day to see if that will help with the additional prednisone and then afterwards for your heartburn.   IF you received an x-ray today, you will receive an invoice from [facility]. Please contact [facility] at [phone] with questions or concerns regarding your invoice.   IF you received labwork today, you will receive an invoice from [facility]. Please contact [facility] at [phone] with questions or concerns regarding your invoice.   Our billing staff will not be able to assist you with questions regarding bills from these companies.  You will be contacted with the lab results as soon as they are available. The fastest way to get your results is to activate your My Chart account. Instructions are located on the last page of this paperwork. If you have not heard from [place] regarding the results in 2 weeks, please contact this office.

## 2016-09-17 NOTE — Progress Notes (Signed)
Erin Cruz  MRN: [id number] DOB: 05-May-1946  PCP: Harrison Mons, PA-C  Chief Complaint  Patient presents with  . Back Pain    hx of back issues/ x 1 wk    Subjective:  Pt presents to clinic for low back pain that started about a week ago.  She has known spinal stenosis and has had surgery for this but every once in a while she has a flair and that is happening currently.  She has used prednisone in the past and that has worked well for her.  Her last flair was 1.5 years ago.  Sitting is hard due to pain  - movement makes it feel better - bilateral low back pain - pinching sensation - no pain radiation at this time - no bowels/bladder problems  When she has been on prednisone in the past she has had trouble sleeping and stomach upset but it has worked well most times for the flair of pain.  Review of Systems  Patient Active Problem List   Diagnosis Date Noted  . Deafness in right ear 03/19/2016  . Osteopenia   . GERD (gastroesophageal reflux disease)   . DDD (degenerative disc disease), lumbar 10/05/2013  . Pure hypercholesterolemia 04/08/2013  . Cancer ([place]) 06/24/1996    Current Outpatient Prescriptions on File Prior to Visit  Medication Sig Dispense Refill  . calcium citrate-vitamin D (CITRACAL+D) 315-200 MG-UNIT per tablet Take 1 tablet by mouth 2 (two) times daily.    Marland Kitchen ezetimibe (ZETIA) 10 MG tablet Take 1 tablet (10 mg total) by mouth daily. 90 tablet 3  . glucosamine-chondroitin 500-400 MG tablet Take 1 tablet by mouth 3 (three) times daily.    Marland Kitchen KRILL OIL PO Take 1 capsule by mouth 2 (two) times daily.    . naproxen (NAPROSYN) 500 MG tablet Take 1 tablet (500 mg total) by mouth 2 (two) times daily as needed for mild pain, moderate pain or headache (TAKE WITH MEALS.). 20 tablet 0   No current facility-administered medications on file prior to visit.     Allergies  Allergen Reactions  . Norco [Hydrocodone-Acetaminophen] Nausea And Vomiting  . Morphine And  Related Other (See Comments)    Patient stated that she can take this but she has to take it with a nausea medication  . Statins Other (See Comments)    myalgias    Pt patients past, family and social history were reviewed and updated.   Objective:  BP 123/76   Pulse 73   Temp 97.6 F (36.4 C) (Oral)   Resp 16   Ht 5\' 1"  (1.549 m)   Wt 136 lb (61.7 kg)   SpO2 97%   BMI 25.70 kg/m   Physical Exam  Constitutional: She is oriented to person, place, and time and well-developed, well-nourished, and in no distress.  HENT:  Head: Normocephalic and atraumatic.  Right Ear: Hearing and external ear normal.  Left Ear: Hearing and external ear normal.  Eyes: Conjunctivae are normal.  Neck: Normal range of motion.  Pulmonary/Chest: Effort normal.  Musculoskeletal:       Lumbar back: She exhibits pain. She exhibits normal range of motion, no tenderness and no spasm.       Back:  Neurological: She is alert and oriented to person, place, and time. She has normal sensation, normal strength and normal reflexes. She displays no weakness and normal reflexes. She has a normal Straight Leg Raise Test. Gait normal. Gait normal.  Skin: Skin is warm and dry.  Psychiatric: Mood, memory, affect and judgment normal.  Vitals reviewed.   Assessment and Plan :  History of spinal stenosis - Plan: predniSONE (DELTASONE) 10 MG tablet  Acute bilateral low back pain without sciatica - Plan: predniSONE (DELTASONE) 10 MG tablet - taper of prednisone -- increase her zantac to bid dosing while on this medication - take with food and try all together in the am to prevent trouble sleeping.  Heartburn - Plan: ranitidine (ZANTAC) 150 MG tablet  Windell Hummingbird PA-C  [facility] 09/17/2016 1:06 PM

## 2016-10-08 ENCOUNTER — Encounter: Payer: Self-pay | Admitting: Gastroenterology

## 2016-10-24 DIAGNOSIS — R69 Illness, unspecified: Secondary | ICD-10-CM | POA: Diagnosis not present

## 2016-10-30 DIAGNOSIS — R69 Illness, unspecified: Secondary | ICD-10-CM | POA: Diagnosis not present

## 2016-12-12 ENCOUNTER — Telehealth: Payer: Self-pay

## 2016-12-12 NOTE — Telephone Encounter (Signed)
Called pt to schedule Medicare Annual Wellness Visit. -nr  

## 2016-12-24 ENCOUNTER — Telehealth: Payer: Self-pay

## 2016-12-24 NOTE — Telephone Encounter (Signed)
Called pt to schedule Medicare Annual Wellness Visit. -nr  

## 2016-12-31 ENCOUNTER — Encounter: Payer: Self-pay | Admitting: Gastroenterology

## 2017-02-17 DIAGNOSIS — H524 Presbyopia: Secondary | ICD-10-CM | POA: Diagnosis not present

## 2017-03-11 ENCOUNTER — Encounter: Payer: Medicare HMO | Admitting: Gastroenterology

## 2017-03-12 ENCOUNTER — Other Ambulatory Visit: Payer: Self-pay | Admitting: Physician Assistant

## 2017-03-12 DIAGNOSIS — Z1231 Encounter for screening mammogram for malignant neoplasm of breast: Secondary | ICD-10-CM

## 2017-03-25 ENCOUNTER — Telehealth: Payer: Self-pay

## 2017-03-25 NOTE — Telephone Encounter (Signed)
Called pt to schedule Medicare Annual Wellness Visit with nurse health advisor. See if patient can come before upcoming office visit or on the same day as her next office visit.    Josepha Pigg, B.A.  Care Guide [phone]

## 2017-04-08 ENCOUNTER — Ambulatory Visit (INDEPENDENT_AMBULATORY_CARE_PROVIDER_SITE_OTHER): Payer: Medicare HMO | Admitting: Physician Assistant

## 2017-04-08 ENCOUNTER — Encounter: Payer: Self-pay | Admitting: Physician Assistant

## 2017-04-08 VITALS — BP 120/72 | HR 112 | Temp 97.9°F | Resp 18 | Ht 61.0 in | Wt 135.8 lb

## 2017-04-08 DIAGNOSIS — Z0000 Encounter for general adult medical examination without abnormal findings: Secondary | ICD-10-CM | POA: Diagnosis not present

## 2017-04-08 DIAGNOSIS — Z23 Encounter for immunization: Secondary | ICD-10-CM | POA: Diagnosis not present

## 2017-04-08 DIAGNOSIS — E7800 Pure hypercholesterolemia, unspecified: Secondary | ICD-10-CM

## 2017-04-08 DIAGNOSIS — Z1211 Encounter for screening for malignant neoplasm of colon: Secondary | ICD-10-CM | POA: Diagnosis not present

## 2017-04-08 DIAGNOSIS — Z6825 Body mass index (BMI) 25.0-25.9, adult: Secondary | ICD-10-CM

## 2017-04-08 NOTE — Progress Notes (Signed)
Presents today for [employer] Visit-Subsequent.   Date of last exam: 02/2016  Interpreter used for this visit? no  Patient Care Team: Harrison Mons, PA-C as PCP - General (Family Medicine) Starling Manns, MD (Orthopedic Surgery)   Other items to address today: none  Cancer Screening: Cervical: last pap 10/05/2013, no history of abnormal pap test, no longer a candidate Breast: 04/12/2016, negative. Repeat is scheduled Colon: 11/2011, 5 year recall due to family history of colon cancer is scheduled Prostate: n/a   Other Screening: Last screening for diabetes: 02/2016, normal at 88 Last lipid screening: patient has hyperlipidemia, 02/2016 TC 282, TG 107, HDL 45, LDL 216  ADVANCE DIRECTIVES: Discussed: yes On File: no, will provide Materials Provided: no  Immunization status:  Immunization History  Administered Date(s) Administered  . Influenza Split 03/11/2012, 04/01/2015  . Influenza,inj,Quad PF,6+ Mos 04/08/2013, 03/19/2016, 04/08/2017  . Pneumococcal Conjugate-13 07/23/2013  . Pneumococcal Polysaccharide-23 04/24/2013, 12/20/2015  . Tdap 04/20/2014  . Zoster 03/24/2012     There are no preventive care reminders to display for this patient.   Functional Status Survey: Is the patient deaf or have difficulty hearing?: Yes (Pt deaf in the right ear.) Does the patient have difficulty seeing, even when wearing glasses/contacts?: No Does the patient have difficulty concentrating, remembering, or making decisions?: No Does the patient have difficulty walking or climbing stairs?: No Does the patient have difficulty dressing or bathing?: No Does the patient have difficulty doing errands alone such as visiting a doctor's office or shopping?: No   Home Environment: single story home, 4 steps to enter  Urinary Incontinence Screening: no incontinence, gets up once at night to urinate  Patient Active Problem List   Diagnosis Date Noted  . History of spinal  stenosis 09/17/2016  . Deafness in right ear 03/19/2016  . Osteopenia   . GERD (gastroesophageal reflux disease)   . DDD (degenerative disc disease), lumbar 10/05/2013  . Pure hypercholesterolemia 04/08/2013  . Basal cell carcinoma (BCC) of neck 06/24/1996     Past Medical History:  Diagnosis Date  . Asthma    hand  . Cancer ([place]) 1998   neck basal cell  . Cataract   . GERD (gastroesophageal reflux disease)   . Osteopenia      Past Surgical History:  Procedure Laterality Date  . CATARACT EXTRACTION W/ INTRAOCULAR LENS IMPLANT  2004   right  . CYSTECTOMY  2008   benign  . EYE SURGERY    . HAND SURGERY  1965 & 1970   ganglion cyst x 2  . POPLITEAL SYNOVIAL CYST EXCISION  1961   left knee  . SPINE SURGERY  07/04/2014   L4 and L5 disc replacement and fusion L4-S1- Dr. Patrice Paradise  . TUBAL LIGATION  1980  . UMBILICAL HERNIA REPAIR  1979     Family History  Problem Relation Age of Onset  . Colon cancer Father   . Hyperlipidemia Father   . Leukemia Father        due to treatment for colon cancer  . Colon cancer Maternal Grandmother   . Muscular dystrophy Mother        [employer]  . Hypertension Sister      Social History   Social History  . Marital status: Single    Spouse name: n/a  . Number of children: 2  . Years of education: Associate's Degree   Occupational History  . retired     Scientist, product/process development   Social History  Main Topics  . Smoking status: Former Research scientist (life sciences)  . Smokeless tobacco: Never Used  . Alcohol use 1.2 oz/week    2 Glasses of wine per week  . Drug use: No  . Sexual activity: No   Other Topics Concern  . Not on file   Social History Narrative   Exercise walking 5 days/week for 30 minutes.   Lives alone.   Daughter lives in [place].   Son lives in [place].     Allergies  Allergen Reactions  . Norco [Hydrocodone-Acetaminophen] Nausea And Vomiting  . Morphine And Related Other (See Comments)    Patient stated that she can take this but she has to  take it with a nausea medication  . Statins Other (See Comments)    myalgias     Prior to Admission medications   Medication Sig Start Date End Date Taking? Authorizing Provider  ezetimibe (ZETIA) 10 MG tablet Take 1 tablet (10 mg total) by mouth daily. 05/01/16  Yes Jacqulyne Gladue, PA-C  glucosamine-chondroitin 500-400 MG tablet Take 1 tablet by mouth 3 (three) times daily.   Yes [provider]  KRILL OIL PO Take 1 capsule by mouth 2 (two) times daily.   Yes [provider]  naproxen (NAPROSYN) 500 MG tablet Take 1 tablet (500 mg total) by mouth 2 (two) times daily as needed for mild pain, moderate pain or headache (TAKE WITH MEALS.). 02/07/15  Yes Street, [place], PA-C  ranitidine (ZANTAC) 150 MG tablet Take 1 tablet (150 mg total) by mouth 2 (two) times daily. 09/17/16  Yes Weber, Damaris Hippo, PA-C  aspirin EC 81 MG tablet Take by mouth.    [provider]  calcium citrate-vitamin D (CITRACAL+D) 315-200 MG-UNIT per tablet Take 1 tablet by mouth 2 (two) times daily.    [provider]     Depression screen [facility] 2/9 04/08/2017 09/17/2016 03/19/2016 04/03/2015 02/09/2015  Decreased Interest 0 0 0 0 0  Down, Depressed, Hopeless 0 0 0 0 0  PHQ - 2 Score 0 0 0 0 0     Fall Risk  04/08/2017 09/17/2016 03/19/2016 04/03/2015 11/17/2014  Falls in the past year? No No No No No   Review of Systems  Constitutional: Negative for activity change, appetite change, chills, fatigue, fever and unexpected weight change.  HENT: Positive for nosebleeds and sinus pressure. Negative for congestion, sinus pain, sore throat, trouble swallowing and voice change.   Respiratory: Negative for cough and shortness of breath.   Cardiovascular: Negative for chest pain, palpitations and leg swelling.  Gastrointestinal: Negative for diarrhea, nausea and vomiting.  Endocrine: Positive for cold intolerance. Negative for polydipsia.  Genitourinary: Negative for dysuria, frequency and urgency.   Musculoskeletal: Negative for myalgias.  Skin: Negative for rash.  Allergic/Immunologic: Positive for environmental allergies.  Neurological: Negative for dizziness and headaches.     PHYSICAL EXAM: BP 120/72 (BP Location: Left Arm, Patient Position: Sitting, Cuff Size: Normal)   Pulse (!) 112   Temp 97.9 F (36.6 C) (Oral)   Resp 18   Ht 5\' 1"  (1.549 m)   Wt 135 lb 12.8 oz (61.6 kg)   SpO2 97%   BMI 25.66 kg/m    Wt Readings from Last 3 Encounters:  04/08/17 135 lb 12.8 oz (61.6 kg)  09/17/16 136 lb (61.7 kg)  03/19/16 135 lb (61.2 kg)      Visual Acuity Screening   Right eye Left eye Both eyes  Without correction: 20/20 20/25 20/20   With correction:  Hearing Screening Comments: Pt could hear colors on the left side but states she is deaf on the right side since she was 71 yrs old.    Physical Exam  Constitutional: She is oriented to person, place, and time. She appears well-developed and well-nourished. She is active and cooperative. No distress.  HENT:  Head: Normocephalic and atraumatic.  Right Ear: Hearing, tympanic membrane, external ear and ear canal normal.  Left Ear: Hearing, tympanic membrane, external ear and ear canal normal.  Nose: Nose normal.  Mouth/Throat: Uvula is midline, oropharynx is clear and moist and mucous membranes are normal. No oral lesions. No uvula swelling. No oropharyngeal exudate.  Eyes: Pupils are equal, round, and reactive to light. Conjunctivae, EOM and lids are normal. Right eye exhibits no discharge. Left eye exhibits no discharge. No scleral icterus.  Fundoscopic exam:      The right eye shows no hemorrhage and no papilledema. The right eye shows red reflex.       The left eye shows no hemorrhage and no papilledema. The left eye shows red reflex.  Neck: Normal range of motion, full passive range of motion without pain and phonation normal. Neck supple. No thyromegaly present.  Cardiovascular: Normal rate, regular rhythm, normal  heart sounds and intact distal pulses.  Exam reveals no gallop and no friction rub.   No murmur heard. Respiratory: Effort normal and breath sounds normal.  GI: Soft. Normal appearance and bowel sounds are normal. There is no hepatosplenomegaly. There is no tenderness.  Musculoskeletal:       Cervical back: Normal.       Thoracic back: Normal.       Lumbar back: Normal.  Lymphadenopathy:       Head (right side): No submandibular and no tonsillar adenopathy present.       Head (left side): No submandibular and no tonsillar adenopathy present.    She has no cervical adenopathy.       Right: No supraclavicular adenopathy present.       Left: No supraclavicular adenopathy present.  Neurological: She is alert and oriented to person, place, and time. She has normal strength. No cranial nerve deficit or sensory deficit.  Skin: Skin is warm and dry. No rash noted. She is not diaphoretic.  Psychiatric: She has a normal mood and affect. Her speech is normal and behavior is normal. Judgment and thought content normal. Cognition and memory are normal.     Education/Counseling provided regarding diet and exercise, prevention of chronic diseases, smoking/tobacco cessation, if applicable, and reviewed "Covered Medicare Preventive Services."   ASSESSMENT/PLAN: Problem List Items Addressed This Visit    Pure hypercholesterolemia    Await labs. Adjust regimen as indicated by results.       Relevant Orders   Comprehensive metabolic panel (Completed)   Lipid panel (Completed)   BMI 25.0-25.9,adult    Encouraged healthy lifestyle changes.       Other Visit Diagnoses    Medicare annual wellness visit, subsequent    -  Primary   Age appropriate health guidance provided   Need for influenza vaccination       Relevant Orders   Flu Vaccine QUAD 36+ mos IM (Completed)   Screening for colon cancer       5 year recall due to family history of colon cancer   Relevant Orders   Ambulatory referral to  Gastroenterology       Return in about 6 months (around 10/07/2017) for re-evaluation of cholesterol.  Fara Chute, PA-C [facility]

## 2017-04-08 NOTE — Assessment & Plan Note (Signed)
Encouraged healthy lifestyle changes.

## 2017-04-08 NOTE — Assessment & Plan Note (Signed)
Await labs. Adjust regimen as indicated by results.  

## 2017-04-08 NOTE — Patient Instructions (Addendum)
   IF you received an x-ray today, you will receive an invoice from [facility]. Please contact [facility] at [phone] with questions or concerns regarding your invoice.   IF you received labwork today, you will receive an invoice from [facility]. Please contact [facility] at [phone] with questions or concerns regarding your invoice.   Our billing staff will not be able to assist you with questions regarding bills from these companies.  You will be contacted with the lab results as soon as they are available. The fastest way to get your results is to activate your My Chart account. Instructions are located on the last page of this paperwork. If you have not heard from us regarding the results in 2 weeks, please contact this office.      Preventive Care 65 Years and Older, Female Preventive care refers to lifestyle choices and visits with your health care provider that can promote health and wellness. What does preventive care include?  A yearly physical exam. This is also called an annual well check.  Dental exams once or twice a year.  Routine eye exams. Ask your health care provider how often you should have your eyes checked.  Personal lifestyle choices, including: ? Daily care of your teeth and gums. ? Regular physical activity. ? Eating a healthy diet. ? Avoiding tobacco and drug use. ? Limiting alcohol use. ? Practicing safe sex. ? Taking low-dose aspirin every day. ? Taking vitamin and mineral supplements as recommended by your health care provider. What happens during an annual well check? The services and screenings done by your health care provider during your annual well check will depend on your age, overall health, lifestyle risk factors, and family history of disease. Counseling Your health care provider may ask you questions about your:  Alcohol use.  Tobacco use.  Drug use.  Emotional well-being.  Home and relationship  well-being.  Sexual activity.  Eating habits.  History of falls.  Memory and ability to understand (cognition).  Work and work environment.  Reproductive health.  Screening You may have the following tests or measurements:  Height, weight, and BMI.  Blood pressure.  Lipid and cholesterol levels. These may be checked every 5 years, or more frequently if you are over 50 years old.  Skin check.  Lung cancer screening. You may have this screening every year starting at age 55 if you have a 30-pack-year history of smoking and currently smoke or have quit within the past 15 years.  Fecal occult blood test (FOBT) of the stool. You may have this test every year starting at age 50.  Flexible sigmoidoscopy or colonoscopy. You may have a sigmoidoscopy every 5 years or a colonoscopy every 10 years starting at age 50.  Hepatitis C blood test.  Hepatitis B blood test.  Sexually transmitted disease (STD) testing.  Diabetes screening. This is done by checking your blood sugar (glucose) after you have not eaten for a while (fasting). You may have this done every 1-3 years.  Bone density scan. This is done to screen for osteoporosis. You may have this done starting at age 65.  Mammogram. This may be done every 1-2 years. Talk to your health care provider about how often you should have regular mammograms.  Talk with your health care provider about your test results, treatment options, and if necessary, the need for more tests. Vaccines Your health care provider may recommend certain vaccines, such as:  Influenza vaccine. This is recommended every year.    diphtheria, and acellular pertussis (Tdap, Td) vaccine. You may need a Td booster every 10 years.  Varicella vaccine. You may need this if you have not been vaccinated.  Zoster vaccine. You may need this after age 44.  Measles, mumps, and rubella (MMR) vaccine. You may need at least one dose of MMR if you were born in  1955 or later. You may also need a second dose.  Pneumococcal 13-valent conjugate (PCV13) vaccine. One dose is recommended after age 44.  Pneumococcal polysaccharide (PPSV23) vaccine. One dose is recommended after age 77.  Meningococcal vaccine. You may need this if you have certain conditions.  Hepatitis A vaccine. You may need this if you have certain conditions or if you travel or work in places where you may be exposed to hepatitis A.  Hepatitis B vaccine. You may need this if you have certain conditions or if you travel or work in places where you may be exposed to hepatitis B.  Haemophilus influenzae type b (Hib) vaccine. You may need this if you have certain conditions.  Talk to your health care provider about which screenings and vaccines you need and how often you need them. This information is not intended to replace advice given to you by your health care provider. Make sure you discuss any questions you have with your health care provider. Document Released: 07/07/2015 Document Revised: 02/28/2016 Document Reviewed: 04/11/2015 Elsevier Interactive Patient Education  2017 [employer].

## 2017-04-09 LAB — LIPID PANEL
CHOLESTEROL TOTAL: 215 mg/dL — AB (ref 100–199)
Chol/HDL Ratio: 4.9 ratio — ABNORMAL HIGH (ref 0.0–4.4)
HDL: 44 mg/dL (ref >39)
LDL CALC: 153 mg/dL — AB (ref 0–99)
TRIGLYCERIDES: 91 mg/dL (ref 0–149)
VLDL CHOLESTEROL CAL: 18 mg/dL (ref 5–40)

## 2017-04-09 LAB — COMPREHENSIVE METABOLIC PANEL
ALBUMIN: 4.4 g/dL (ref 3.6–4.8)
ALK PHOS: 83 IU/L (ref 39–117)
ALT: 16 IU/L (ref 0–32)
AST: 22 IU/L (ref 0–40)
Albumin/Globulin Ratio: 1.9 (ref 1.2–2.2)
BUN / CREAT RATIO: 19 (ref 12–28)
BUN: 16 mg/dL (ref 8–27)
Bilirubin Total: 0.4 mg/dL (ref 0.0–1.2)
CO2: 26 mmol/L (ref 20–29)
CREATININE: 0.84 mg/dL (ref 0.57–1.00)
Calcium: 9.2 mg/dL (ref 8.7–10.3)
Chloride: 102 mmol/L (ref 96–106)
GFR calc Af Amer: 83 mL/min/{1.73_m2} (ref >59)
GFR calc non Af Amer: 72 mL/min/{1.73_m2} (ref >59)
GLUCOSE: 87 mg/dL (ref 65–99)
Globulin, Total: 2.3 g/dL (ref 1.5–4.5)
Potassium: 4.8 mmol/L (ref 3.5–5.2)
Sodium: 143 mmol/L (ref 134–144)
Total Protein: 6.7 g/dL (ref 6.0–8.5)

## 2017-04-15 ENCOUNTER — Ambulatory Visit
Admission: RE | Admit: 2017-04-15 | Discharge: 2017-04-15 | Disposition: A | Discharge status: Home / Self Care | Payer: Medicare HMO | Source: Ambulatory Visit | Attending: Physician Assistant | Admitting: Physician Assistant

## 2017-04-15 DIAGNOSIS — Z1231 Encounter for screening mammogram for malignant neoplasm of breast: Secondary | ICD-10-CM | POA: Diagnosis not present

## 2017-04-18 ENCOUNTER — Encounter: Payer: Self-pay | Admitting: Gastroenterology

## 2017-04-18 ENCOUNTER — Ambulatory Visit (AMBULATORY_SURGERY_CENTER): Payer: Self-pay | Admitting: *Deleted

## 2017-04-18 VITALS — Ht 61.5 in | Wt 140.2 lb

## 2017-04-18 DIAGNOSIS — Z800 Family history of malignant neoplasm of digestive organs: Secondary | ICD-10-CM

## 2017-04-18 MED ORDER — NA SULFATE-K SULFATE-MG SULF 17.5-3.13-1.6 GM/177ML PO SOLN: 1.0000 | Freq: Once | ORAL | 0 refills | Status: AC

## 2017-04-18 NOTE — Progress Notes (Signed)
No egg or soy allergy known to patient  No issues with past sedation with any surgeries  or procedures, no intubation problems  No diet pills per patient No home 02 use per patient  No blood thinners per patient  Pt denies issues with constipation  No A fib or A flutter  EMMI video sent to pt's e mail   Pt declined  Pay no more than $50 coupon to pt today for her suprep

## 2017-04-25 ENCOUNTER — Encounter: Payer: Medicare HMO | Admitting: Gastroenterology

## 2017-05-01 ENCOUNTER — Telehealth: Payer: Self-pay | Admitting: *Deleted

## 2017-05-01 ENCOUNTER — Telehealth: Payer: Self-pay | Admitting: Gastroenterology

## 2017-05-01 NOTE — Telephone Encounter (Signed)
Patient called inquiring if it is ok to take HA medication today.  LMOM for patient.  Ok to take HA medication today.  Colon procedure 05/02/17  KM

## 2017-05-01 NOTE — Telephone Encounter (Signed)
Pt. Stated that she has not been able to keep anything down even with taking 2 zantac,she wants to cancel procedure for tomorrow and she will call back when she feels better and reschedule procedure.

## 2017-05-01 NOTE — Telephone Encounter (Signed)
ok 

## 2017-05-02 ENCOUNTER — Encounter: Payer: Medicare HMO | Admitting: Gastroenterology

## 2017-06-20 ENCOUNTER — Telehealth: Payer: Self-pay | Admitting: Physician Assistant

## 2017-06-20 NOTE — Telephone Encounter (Signed)
Copied from [place]. Topic: Quick Communication - Rx Refill/Question >> Jun 20, 2017  3:04 PM Oliver Pila B wrote: Pt called to have her Rx of  ezetimibe (ZETIA) 10 MG tablet [[id number]]  sent to [facility] in [place], contact pt or pharmacy if needed

## 2017-06-23 ENCOUNTER — Other Ambulatory Visit: Payer: Self-pay

## 2017-06-23 MED ORDER — EZETIMIBE 10 MG PO TABS: 10.0000 mg | ORAL_TABLET | Freq: Every day | ORAL | 1 refills | Status: DC

## 2017-06-23 NOTE — Telephone Encounter (Signed)
Refill ssent to [facility] pharmacy.

## 2017-09-11 DIAGNOSIS — R69 Illness, unspecified: Secondary | ICD-10-CM | POA: Diagnosis not present

## 2017-09-29 ENCOUNTER — Encounter: Payer: Self-pay | Admitting: Physician Assistant

## 2018-01-06 ENCOUNTER — Other Ambulatory Visit: Payer: Self-pay | Admitting: *Deleted

## 2018-01-06 ENCOUNTER — Telehealth: Payer: Self-pay | Admitting: Physician Assistant

## 2018-01-06 DIAGNOSIS — R69 Illness, unspecified: Secondary | ICD-10-CM | POA: Diagnosis not present

## 2018-01-06 DIAGNOSIS — E7800 Pure hypercholesterolemia, unspecified: Secondary | ICD-10-CM

## 2018-01-06 MED ORDER — EZETIMIBE 10 MG PO TABS: 10.0000 mg | ORAL_TABLET | Freq: Every day | ORAL | 0 refills | Status: DC

## 2018-01-06 NOTE — Telephone Encounter (Signed)
Prescription sent. Patient will need an appointment for any additional refills

## 2018-01-06 NOTE — Telephone Encounter (Unsigned)
Copied from [place] [id number]. Topic: Quick Communication - Rx Refill/Question >> Jan 06, 2018  9:00 AM Judyann Munson wrote: Medication: ezetimibe (ZETIA) 10 MG tablet   Has the patient contacted their pharmacy? yes  Preferred Pharmacy (with phone number or street name): [facility] [address] [phone] (Phone) [phone] (Fax)  Agent: Please be advised that RX refills may take up to 3 business days. We ask that you follow-up with your pharmacy.

## 2018-01-07 DIAGNOSIS — R69 Illness, unspecified: Secondary | ICD-10-CM | POA: Diagnosis not present

## 2018-01-20 DIAGNOSIS — Z8249 Family history of ischemic heart disease and other diseases of the circulatory system: Secondary | ICD-10-CM | POA: Diagnosis not present

## 2018-01-20 DIAGNOSIS — Z823 Family history of stroke: Secondary | ICD-10-CM | POA: Diagnosis not present

## 2018-01-20 DIAGNOSIS — Z87891 Personal history of nicotine dependence: Secondary | ICD-10-CM | POA: Diagnosis not present

## 2018-01-20 DIAGNOSIS — K219 Gastro-esophageal reflux disease without esophagitis: Secondary | ICD-10-CM | POA: Diagnosis not present

## 2018-01-20 DIAGNOSIS — Z809 Family history of malignant neoplasm, unspecified: Secondary | ICD-10-CM | POA: Diagnosis not present

## 2018-01-20 DIAGNOSIS — E785 Hyperlipidemia, unspecified: Secondary | ICD-10-CM | POA: Diagnosis not present

## 2018-02-18 DIAGNOSIS — H524 Presbyopia: Secondary | ICD-10-CM | POA: Diagnosis not present

## 2018-02-18 DIAGNOSIS — H2512 Age-related nuclear cataract, left eye: Secondary | ICD-10-CM | POA: Diagnosis not present

## 2018-03-17 ENCOUNTER — Telehealth: Payer: Self-pay | Admitting: Family Medicine

## 2018-03-17 DIAGNOSIS — Z23 Encounter for immunization: Secondary | ICD-10-CM | POA: Diagnosis not present

## 2018-03-17 NOTE — Telephone Encounter (Signed)
Copied from [place] [id number]. Topic: General - Other >> Mar 17, 2018 12:33 PM Judyann Munson wrote: Reason for CRM:  Patient is calling to advise the medication ranitidine (ZANTAC) 150 MG tablet is on FDA for replacement, she is requesting something different to be called in to [facility] on [address]. She is requesting a nurse give her a call back. Please advise

## 2018-03-19 MED ORDER — FAMOTIDINE 20 MG PO TABS: 20.0000 mg | ORAL_TABLET | Freq: Two times a day (BID) | ORAL | 2 refills | Status: DC | PRN

## 2018-03-19 NOTE — Addendum Note (Signed)
Addended by: Rutherford Guys on: 03/19/2018 08:39 AM   Modules accepted: Orders

## 2018-03-19 NOTE — Telephone Encounter (Signed)
done

## 2018-04-14 ENCOUNTER — Encounter: Payer: Self-pay | Admitting: Family Medicine

## 2018-04-14 ENCOUNTER — Telehealth: Payer: Self-pay | Admitting: Physician Assistant

## 2018-04-14 ENCOUNTER — Ambulatory Visit (INDEPENDENT_AMBULATORY_CARE_PROVIDER_SITE_OTHER): Payer: Medicare HMO | Admitting: Physician Assistant

## 2018-04-14 ENCOUNTER — Encounter: Payer: Self-pay | Admitting: Physician Assistant

## 2018-04-14 ENCOUNTER — Other Ambulatory Visit: Payer: Self-pay

## 2018-04-14 VITALS — BP 127/75 | HR 80 | Temp 97.5°F | Resp 18 | Ht 61.0 in | Wt 127.0 lb

## 2018-04-14 DIAGNOSIS — E7800 Pure hypercholesterolemia, unspecified: Secondary | ICD-10-CM

## 2018-04-14 DIAGNOSIS — M8580 Other specified disorders of bone density and structure, unspecified site: Secondary | ICD-10-CM

## 2018-04-14 DIAGNOSIS — Z0000 Encounter for general adult medical examination without abnormal findings: Secondary | ICD-10-CM | POA: Diagnosis not present

## 2018-04-14 DIAGNOSIS — Z1211 Encounter for screening for malignant neoplasm of colon: Secondary | ICD-10-CM

## 2018-04-14 DIAGNOSIS — L989 Disorder of the skin and subcutaneous tissue, unspecified: Secondary | ICD-10-CM

## 2018-04-14 DIAGNOSIS — Z1239 Encounter for other screening for malignant neoplasm of breast: Secondary | ICD-10-CM

## 2018-04-14 LAB — CBC WITH DIFFERENTIAL/PLATELET
BASOS ABS: 0.1 10*3/uL (ref 0.0–0.2)
Basos: 1 %
EOS (ABSOLUTE): 0.2 10*3/uL (ref 0.0–0.4)
Eos: 4 %
Hematocrit: 42.1 % (ref 34.0–46.6)
Hemoglobin: 13.9 g/dL (ref 11.1–15.9)
Immature Grans (Abs): 0 10*3/uL (ref 0.0–0.1)
Immature Granulocytes: 0 %
LYMPHS ABS: 2.3 10*3/uL (ref 0.7–3.1)
Lymphs: 41 %
MCH: 30 pg (ref 26.6–33.0)
MCHC: 33 g/dL (ref 31.5–35.7)
MCV: 91 fL (ref 79–97)
MONOCYTES: 11 %
MONOS ABS: 0.6 10*3/uL (ref 0.1–0.9)
Neutrophils Absolute: 2.4 10*3/uL (ref 1.4–7.0)
Neutrophils: 43 %
Platelets: 255 10*3/uL (ref 150–450)
RBC: 4.64 x10E6/uL (ref 3.77–5.28)
RDW: 13.6 % (ref 12.3–15.4)
WBC: 5.5 10*3/uL (ref 3.4–10.8)

## 2018-04-14 LAB — CMP14+EGFR
A/G RATIO: 1.8 (ref 1.2–2.2)
ALK PHOS: 77 IU/L (ref 39–117)
ALT: 12 IU/L (ref 0–32)
AST: 20 IU/L (ref 0–40)
Albumin: 4.3 g/dL (ref 3.5–4.8)
BUN/Creatinine Ratio: 12 (ref 12–28)
BUN: 12 mg/dL (ref 8–27)
Bilirubin Total: 0.3 mg/dL (ref 0.0–1.2)
CALCIUM: 9.2 mg/dL (ref 8.7–10.3)
CO2: 23 mmol/L (ref 20–29)
Chloride: 104 mmol/L (ref 96–106)
Creatinine, Ser: 1 mg/dL (ref 0.57–1.00)
GFR calc Af Amer: 66 mL/min/{1.73_m2} (ref >59)
GFR calc non Af Amer: 57 mL/min/{1.73_m2} — ABNORMAL LOW (ref >59)
GLOBULIN, TOTAL: 2.4 g/dL (ref 1.5–4.5)
Glucose: 87 mg/dL (ref 65–99)
Potassium: 4.2 mmol/L (ref 3.5–5.2)
SODIUM: 144 mmol/L (ref 134–144)
Total Protein: 6.7 g/dL (ref 6.0–8.5)

## 2018-04-14 LAB — LIPID PANEL
CHOL/HDL RATIO: 4.5 ratio — AB (ref 0.0–4.4)
CHOLESTEROL TOTAL: 210 mg/dL — AB (ref 100–199)
HDL: 47 mg/dL (ref >39)
LDL CALC: 147 mg/dL — AB (ref 0–99)
TRIGLYCERIDES: 81 mg/dL (ref 0–149)
VLDL CHOLESTEROL CAL: 16 mg/dL (ref 5–40)

## 2018-04-14 MED ORDER — EZETIMIBE 10 MG PO TABS: 10.0000 mg | ORAL_TABLET | Freq: Every day | ORAL | 3 refills | Status: DC

## 2018-04-14 NOTE — Patient Instructions (Addendum)
   If you have lab work done today you will be contacted with your lab results within the next 2 weeks.  If you have not heard from us then please contact us. The fastest way to get your results is to register for My Chart.  Health Maintenance for Postmenopausal Women Menopause is a normal process in which your reproductive ability comes to an end. This process happens gradually over a span of months to years, usually between the ages of 48 and 55. Menopause is complete when you have missed 12 consecutive menstrual periods. It is important to talk with your health care provider about some of the most common conditions that affect postmenopausal women, such as heart disease, cancer, and bone loss (osteoporosis). Adopting a healthy lifestyle and getting preventive care can help to promote your health and wellness. Those actions can also lower your chances of developing some of these common conditions. What should I know about menopause? During menopause, you may experience a number of symptoms, such as:  Moderate-to-severe hot flashes.  Night sweats.  Decrease in sex drive.  Mood swings.  Headaches.  Tiredness.  Irritability.  Memory problems.  Insomnia.  Choosing to treat or not to treat menopausal changes is an individual decision that you make with your health care provider. What should I know about hormone replacement therapy and supplements? Hormone therapy products are effective for treating symptoms that are associated with menopause, such as hot flashes and night sweats. Hormone replacement carries certain risks, especially as you become older. If you are thinking about using estrogen or estrogen with progestin treatments, discuss the benefits and risks with your health care provider. What should I know about heart disease and stroke? Heart disease, heart attack, and stroke become more likely as you age. This may be due, in part, to the hormonal changes that your body  experiences during menopause. These can affect how your body processes dietary fats, triglycerides, and cholesterol. Heart attack and stroke are both medical emergencies. There are many things that you can do to help prevent heart disease and stroke:  Have your blood pressure checked at least every 1-2 years. High blood pressure causes heart disease and increases the risk of stroke.  If you are 55-79 years old, ask your health care provider if you should take aspirin to prevent a heart attack or a stroke.  Do not use any tobacco products, including cigarettes, chewing tobacco, or electronic cigarettes. If you need help quitting, ask your health care provider.  It is important to eat a healthy diet and maintain a healthy weight. ? Be sure to include plenty of vegetables, fruits, low-fat dairy products, and lean protein. ? Avoid eating foods that are high in solid fats, added sugars, or salt (sodium).  Get regular exercise. This is one of the most important things that you can do for your health. ? Try to exercise for at least 150 minutes each week. The type of exercise that you do should increase your heart rate and make you sweat. This is known as moderate-intensity exercise. ? Try to do strengthening exercises at least twice each week. Do these in addition to the moderate-intensity exercise.  Know your numbers.Ask your health care provider to check your cholesterol and your blood glucose. Continue to have your blood tested as directed by your health care provider.  What should I know about cancer screening? There are several types of cancer. Take the following steps to reduce your risk and to catch any   cancer development as early as possible. Breast Cancer  Practice breast self-awareness. ? This means understanding how your breasts normally appear and feel. ? It also means doing regular breast self-exams. Let your health care provider know about any changes, no matter how small.  If you  are 40 or older, have a clinician do a breast exam (clinical breast exam or CBE) every year. Depending on your age, family history, and medical history, it may be recommended that you also have a yearly breast X-ray (mammogram).  If you have a family history of breast cancer, talk with your health care provider about genetic screening.  If you are at high risk for breast cancer, talk with your health care provider about having an MRI and a mammogram every year.  Breast cancer (BRCA) gene test is recommended for women who have family members with BRCA-related cancers. Results of the assessment will determine the need for genetic counseling and BRCA1 and for BRCA2 testing. BRCA-related cancers include these types: ? Breast. This occurs in males or females. ? Ovarian. ? Tubal. This may also be called fallopian tube cancer. ? Cancer of the abdominal or pelvic lining (peritoneal cancer). ? Prostate. ? Pancreatic.  Cervical, Uterine, and Ovarian Cancer Your health care provider may recommend that you be screened regularly for cancer of the pelvic organs. These include your ovaries, uterus, and vagina. This screening involves a pelvic exam, which includes checking for microscopic changes to the surface of your cervix (Pap test).  For women ages 21-65, health care providers may recommend a pelvic exam and a Pap test every three years. For women ages 30-65, they may recommend the Pap test and pelvic exam, combined with testing for human papilloma virus (HPV), every five years. Some types of HPV increase your risk of cervical cancer. Testing for HPV may also be done on women of any age who have unclear Pap test results.  Other health care providers may not recommend any screening for nonpregnant women who are considered low risk for pelvic cancer and have no symptoms. Ask your health care provider if a screening pelvic exam is right for you.  If you have had past treatment for cervical cancer or a  condition that could lead to cancer, you need Pap tests and screening for cancer for at least 20 years after your treatment. If Pap tests have been discontinued for you, your risk factors (such as having a new sexual partner) need to be reassessed to determine if you should start having screenings again. Some women have medical problems that increase the chance of getting cervical cancer. In these cases, your health care provider may recommend that you have screening and Pap tests more often.  If you have a family history of uterine cancer or ovarian cancer, talk with your health care provider about genetic screening.  If you have vaginal bleeding after reaching menopause, tell your health care provider.  There are currently no reliable tests available to screen for ovarian cancer.  Lung Cancer Lung cancer screening is recommended for adults 55-80 years old who are at high risk for lung cancer because of a history of smoking. A yearly low-dose CT scan of the lungs is recommended if you:  Currently smoke.  Have a history of at least 30 pack-years of smoking and you currently smoke or have quit within the past 15 years. A pack-year is smoking an average of one pack of cigarettes per day for one year.  Yearly screening should:  Continue until   it has been 15 years since you quit.  Stop if you develop a health problem that would prevent you from having lung cancer treatment.  Colorectal Cancer  This type of cancer can be detected and can often be prevented.  Routine colorectal cancer screening usually begins at age 50 and continues through age 75.  If you have risk factors for colon cancer, your health care provider may recommend that you be screened at an earlier age.  If you have a family history of colorectal cancer, talk with your health care provider about genetic screening.  Your health care provider may also recommend using home test kits to check for hidden blood in your stool.  A  small camera at the end of a tube can be used to examine your colon directly (sigmoidoscopy or colonoscopy). This is done to check for the earliest forms of colorectal cancer.  Direct examination of the colon should be repeated every 5-10 years until age 75. However, if early forms of precancerous polyps or small growths are found or if you have a family history or genetic risk for colorectal cancer, you may need to be screened more often.  Skin Cancer  Check your skin from head to toe regularly.  Monitor any moles. Be sure to tell your health care provider: ? About any new moles or changes in moles, especially if there is a change in a mole's shape or color. ? If you have a mole that is larger than the size of a pencil eraser.  If any of your family members has a history of skin cancer, especially at a young age, talk with your health care provider about genetic screening.  Always use sunscreen. Apply sunscreen liberally and repeatedly throughout the day.  Whenever you are outside, protect yourself by wearing long sleeves, pants, a wide-brimmed hat, and sunglasses.  What should I know about osteoporosis? Osteoporosis is a condition in which bone destruction happens more quickly than new bone creation. After menopause, you may be at an increased risk for osteoporosis. To help prevent osteoporosis or the bone fractures that can happen because of osteoporosis, the following is recommended:  If you are 19-50 years old, get at least 1,000 mg of calcium and at least 600 mg of vitamin D per day.  If you are older than age 50 but younger than age 70, get at least 1,200 mg of calcium and at least 600 mg of vitamin D per day.  If you are older than age 70, get at least 1,200 mg of calcium and at least 800 mg of vitamin D per day.  Smoking and excessive alcohol intake increase the risk of osteoporosis. Eat foods that are rich in calcium and vitamin D, and do weight-bearing exercises several times  each week as directed by your health care provider. What should I know about how menopause affects my mental health? Depression may occur at any age, but it is more common as you become older. Common symptoms of depression include:  Low or sad mood.  Changes in sleep patterns.  Changes in appetite or eating patterns.  Feeling an overall lack of motivation or enjoyment of activities that you previously enjoyed.  Frequent crying spells.  Talk with your health care provider if you think that you are experiencing depression. What should I know about immunizations? It is important that you get and maintain your immunizations. These include:  Tetanus, diphtheria, and pertussis (Tdap) booster vaccine.  Influenza every year before the flu season   begins.  Pneumonia vaccine.  Shingles vaccine.  Your health care provider may also recommend other immunizations. This information is not intended to replace advice given to you by your health care provider. Make sure you discuss any questions you have with your health care provider. Document Released: 08/02/2005 Document Revised: 12/29/2015 Document Reviewed: 03/14/2015 Elsevier Interactive Patient Education  2018 Elsevier Inc.   IF you received an x-ray today, you will receive an invoice from [facility]. Please contact  Radiology at [phone] with questions or concerns regarding your invoice.   IF you received labwork today, you will receive an invoice from [facility]. Please contact [facility] at [phone] with questions or concerns regarding your invoice.   Our billing staff will not be able to assist you with questions regarding bills from these companies.  You will be contacted with the lab results as soon as they are available. The fastest way to get your results is to activate your My Chart account. Instructions are located on the last page of this paperwork. If you have not heard from us regarding the results in 2  weeks, please contact this office.     

## 2018-04-14 NOTE — Telephone Encounter (Signed)
Patient was referred to [facility] and she did not like the office. She is requesting referral to a different office. Please advise, thank you.

## 2018-04-14 NOTE — Progress Notes (Signed)
Presents today for [employer] Visit-Subsequent.   Date of last exam: 04/08/17  Interpreter used for this visit? no  Patient Care Team: Erin Cruz as PCP - General (Physician Assistant) Starling Manns, MD (Orthopedic Surgery)   Other items to address today: none  Will need med refills for zetia in the next 3 months. Has been on this dose for 3 years.  Cancer Screening: Cervical: yes, age 72, never had an abnormal.  Breast: yes, 04/15/18 Colon: no, 11/27/2011, q 5 years Prostate: n/a   Other Screening: Last screening for diabetes: 03/2017 Last lipid screening: PMH of HLD  ADVANCE DIRECTIVES: Discussed: yes On File: yes Materials Provided: no  Immunization status:  Immunization History  Administered Date(s) Administered  . Influenza Split 03/11/2012, 04/01/2015  . Influenza, High Dose Seasonal PF 03/17/2018  . Influenza,inj,Quad PF,6+ Mos 04/08/2013, 03/19/2016, 04/08/2017  . Influenza-Unspecified 03/13/2018  . Pneumococcal Conjugate-13 07/23/2013  . Pneumococcal Polysaccharide-23 04/24/2013, 12/20/2015  . Tdap 04/20/2014  . Zoster 03/24/2012     Health Maintenance Due  Topic Date Due  . COLONOSCOPY  11/26/2016     Functional Status Survey: Is the patient deaf or have difficulty hearing?: Yes(deaf in one ear ) Does the patient have difficulty seeing, even when wearing glasses/contacts?: No Does the patient have difficulty concentrating, remembering, or making decisions?: No Does the patient have difficulty walking or climbing stairs?: No Does the patient have difficulty dressing or bathing?: No Does the patient have difficulty doing errands alone such as visiting a doctor's office or shopping?: No   6CIT Screen 04/14/2018  What Year? 0 points  What month? 0 points  What time? 0 points  Count back from 20 0 points  Months in reverse 0 points  Repeat phrase 0 points  Total Score 0      Home Environment: Lives by  herself at home. Feels safe. Can perform all ADLs without any issues.   Urinary Incontinence Screening: Negative for stress and urge incontinence.  Patient Active Problem List   Diagnosis Date Noted  . BMI 25.0-25.9,adult 04/08/2017  . History of spinal stenosis 09/17/2016  . Deafness in right ear 03/19/2016  . Osteopenia   . GERD (gastroesophageal reflux disease)   . DDD (degenerative disc disease), lumbar 10/05/2013  . Pure hypercholesterolemia 04/08/2013  . Basal cell carcinoma (BCC) of neck 06/24/1996     Past Medical History:  Diagnosis Date  . Allergy    seasonal   . Arthritis    hand   . Cancer ([place]) 1998   neck basal cell  . Cataract    left side removed   . GERD (gastroesophageal reflux disease)   . Hyperlipidemia   . Osteopenia   . Raynaud phenomenon      Past Surgical History:  Procedure Laterality Date  . CATARACT EXTRACTION W/ INTRAOCULAR LENS IMPLANT Left 2004   left   . COLONOSCOPY     2008 [place], 2013 [place]- normal   . CYSTECTOMY  2008   benign from neck   . EYE SURGERY    . HAND SURGERY  1965 & 1970   ganglion cyst x 2  . POPLITEAL SYNOVIAL CYST EXCISION  1961   left knee  . SPINE SURGERY  07/04/2014   L4 and L5 disc replacement and fusion L4-S1- Dr. Patrice Paradise  . TUBAL LIGATION  1980  . UMBILICAL HERNIA REPAIR  1979     Family History  Problem Relation Age of Onset  .  Colon cancer Father   . Hyperlipidemia Father   . Leukemia Father        due to treatment for colon cancer  . Colon polyps Father   . Colon cancer Maternal Grandmother   . Muscular dystrophy Mother        [employer]  . Hypertension Sister   . Breast cancer Paternal Aunt   . Breast cancer Paternal Uncle   . Esophageal cancer Neg Hx   . Rectal cancer Neg Hx   . Stomach cancer Neg Hx      Social History   Socioeconomic History  . Marital status: Single    Spouse name: n/a  . Number of children: 2  . Years of education: Associate's Degree  . Highest education level:  Not on file  Occupational History  . Occupation: retired    Comment: mortgage  Social Needs  . Financial resource strain: Not hard at all  . Food insecurity:    Worry: Never true    Inability: Never true  . Transportation needs:    Medical: No    Non-medical: No  Tobacco Use  . Smoking status: Former Research scientist (life sciences)  . Smokeless tobacco: Never Used  . Tobacco comment: a couple cigarettes a day  Substance and Sexual Activity  . Alcohol use: Yes    Alcohol/week: 2.0 standard drinks    Types: 2 Glasses of wine per week  . Drug use: No  . Sexual activity: Not Currently  Lifestyle  . Physical activity:    Days per week: 7 days    Minutes per session: 50 min  . Stress: Only a little  Relationships  . Social connections:    Talks on phone: Not on file    Gets together: Not on file    Attends religious service: Not on file    Active member of club or organization: Not on file    Attends meetings of clubs or organizations: Not on file    Relationship status: Not on file  . Intimate partner violence:    Fear of current or ex partner: Not on file    Emotionally abused: Not on file    Physically abused: Not on file    Forced sexual activity: Not on file  Other Topics Concern  . Not on file  Social History Narrative   Exercise: Does exercise 7 days a week, classes 6 days a week and walking one day a week.   Diet: Loves vegetables, fruits, and quinoa. Chicken and fish. Yogurt, cheese, and almond milk. Drinks water.    Lives alone.   Daughter lives in [place].   Son lives in [place].     Allergies  Allergen Reactions  . Norco [Hydrocodone-Acetaminophen] Nausea And Vomiting  . Morphine And Related Other (See Comments)    Patient stated that she can take this but she has to take it with a nausea medication  . Statins Other (See Comments)    myalgias     Prior to Admission medications   Medication Sig Start Date End Date Taking? Authorizing Provider  ezetimibe (ZETIA) 10 MG tablet  Take 1 tablet (10 mg total) by mouth daily. No more refills without office visit 01/06/18  Yes Rutherford Guys, MD  famotidine (PEPCID) 20 MG tablet Take 1 tablet (20 mg total) by mouth 2 (two) times daily as needed for heartburn or indigestion. 03/19/18  Yes Rutherford Guys, MD  glucosamine-chondroitin 500-400 MG tablet Take 1 tablet by mouth 3 (three) times daily.  Yes [provider]  KRILL OIL PO Take 1 capsule by mouth 2 (two) times daily.   Yes [provider]  naproxen (NAPROSYN) 500 MG tablet Take 1 tablet (500 mg total) by mouth 2 (two) times daily as needed for mild pain, moderate pain or headache (TAKE WITH MEALS.). 02/07/15  [address], [place], [place]     Depression screen [facility] 2/9 04/14/2018 04/08/2017 09/17/2016 03/19/2016 04/03/2015  Decreased Interest 0 0 0 0 0  Down, Depressed, Hopeless 0 0 0 0 0  PHQ - 2 Score 0 0 0 0 0     Fall Risk  04/14/2018 04/08/2017 09/17/2016 03/19/2016 04/03/2015  Falls in the past year? _0       PHYSICAL EXAM: BP 127/75   Pulse 80   Temp (!) 97.5 F (36.4 C) (Oral)   Resp 18   Ht _1  (1.549 m)   Wt 127 lb (57.6 kg)   SpO2 97%   BMI 24.00 kg/m    Wt Readings from Last 3 Encounters:  04/14/18 127 lb (57.6 kg)  04/18/17 140 lb 3.2 oz (63.6 kg)  04/08/17 135 lb 12.8 oz (61.6 kg)     No exam data present   Physical Exam  Constitutional: She is oriented to person, place, and time. She appears well-developed and well-nourished. No distress.  HENT:  Head: Normocephalic and atraumatic.  Right Ear: Hearing, tympanic membrane, external ear and ear canal normal.  Left Ear: Hearing, tympanic membrane, external ear and ear canal normal.  Nose: Nose normal.  Mouth/Throat: Uvula is midline, oropharynx is clear and moist and mucous membranes are normal. No oropharyngeal exudate.  Eyes: Pupils are equal, round, and reactive to light. Conjunctivae, EOM and lids are normal. No scleral icterus.  Neck: Trachea  normal and normal range of motion. No thyroid mass and no thyromegaly present.  Cardiovascular: Normal rate, regular rhythm, normal heart sounds and intact distal pulses.  Pulmonary/Chest: Effort normal and breath sounds normal.  Abdominal: Soft. Normal appearance and bowel sounds are normal. There is no tenderness.  Genitourinary:  Genitourinary Comments: Deferred GU and breast exam.   Musculoskeletal: Normal range of motion.  Lymphadenopathy:       Head (right side): No tonsillar, no preauricular, no posterior auricular and no occipital adenopathy present.       Head (left side): No tonsillar, no preauricular, no posterior auricular and no occipital adenopathy present.    She has no cervical adenopathy.       Right: No supraclavicular adenopathy present.       Left: No supraclavicular adenopathy present.  Neurological: She is alert and oriented to person, place, and time. She has normal strength and normal reflexes. No sensory deficit.  30 sec chair stand test score of 18  Skin: Skin is warm and dry.      Education/Counseling provided regarding diet and exercise, prevention of chronic diseases, smoking/tobacco cessation, if applicable, and reviewed "Covered Medicare Preventive Services."   ASSESSMENT/PLAN: 1. Medicare annual wellness visit, subsequent F/u in one year.   2. Osteopenia, unspecified location - DG Bone Density; Future  3. Pure hypercholesterolemia - Lipid panel - CMP14+EGFR - CBC with Differential/Platelet - ezetimibe (ZETIA) 10 MG tablet; Take 1 tablet (10 mg total) by mouth daily.  Dispense: 90 tablet; Refill: 3  4. Screen for colon cancer - Ambulatory referral to Gastroenterology  5. Screening for breast cancer - MM DIGITAL SCREENING BILATERAL; Future  Tenna Delaine, PA-C  [facility] at [facility] 04/14/2018 8:53 AM

## 2018-04-14 NOTE — Telephone Encounter (Signed)
Referral placed.  To referral team: could you make sure this goes to somewhere other than [facility] dermatology.

## 2018-04-14 NOTE — Telephone Encounter (Signed)
Copied from [place] [id number]. Topic: Referral - Request for Referral >> Apr 14, 2018  1:07 PM Margot Ables wrote: Has patient seen PCP for this complaint? AWV today *If NO, is insurance requiring patient see PCP for this issue before PCP can refer them? Referral for which specialty: Dermatology Preferred provider/office: Pt has been to [facility] and did not like the office - requesting a different office Reason for referral: rash/bumps/itchy spots on back, it is aggravated by heat/showering, and full skin check

## 2018-06-23 ENCOUNTER — Ambulatory Visit
Admission: RE | Admit: 2018-06-23 | Discharge: 2018-06-23 | Disposition: A | Discharge status: Home / Self Care | Payer: Medicare HMO | Source: Ambulatory Visit | Attending: Physician Assistant | Admitting: Physician Assistant

## 2018-06-23 ENCOUNTER — Ambulatory Visit: Payer: Medicare HMO

## 2018-06-23 ENCOUNTER — Other Ambulatory Visit: Payer: Self-pay | Admitting: Physician Assistant

## 2018-06-23 DIAGNOSIS — Z1239 Encounter for other screening for malignant neoplasm of breast: Secondary | ICD-10-CM

## 2018-06-23 DIAGNOSIS — Z780 Asymptomatic menopausal state: Secondary | ICD-10-CM | POA: Diagnosis not present

## 2018-06-23 DIAGNOSIS — M8589 Other specified disorders of bone density and structure, multiple sites: Secondary | ICD-10-CM | POA: Diagnosis not present

## 2018-06-23 DIAGNOSIS — Z1231 Encounter for screening mammogram for malignant neoplasm of breast: Secondary | ICD-10-CM | POA: Diagnosis not present

## 2018-06-23 DIAGNOSIS — M8580 Other specified disorders of bone density and structure, unspecified site: Secondary | ICD-10-CM

## 2018-06-30 DIAGNOSIS — R69 Illness, unspecified: Secondary | ICD-10-CM | POA: Diagnosis not present

## 2018-07-09 ENCOUNTER — Telehealth: Payer: Self-pay | Admitting: Family Medicine

## 2018-07-09 NOTE — Telephone Encounter (Signed)
Copied from [place] [phone]. Topic: General - Other >> Jul 09, 2018  9:04 AM Ivar Drape wrote: Reason for CRM:   Patient would like a call back to discuss the results of her Bone Density test that was released to her in mychart.

## 2018-07-10 NOTE — Telephone Encounter (Signed)
pls see note. Test was performed on 06/23/18

## 2018-08-08 NOTE — Telephone Encounter (Signed)
Your bone density scan shows OSTEOPENIA. Osteopenia is low bone density.  Menopause is a risk factor. To decrease your fracture risk we advised taking an adequate amount of vitamin D 1000 international units and 1200mg  of calcium as well as weight bearing exercise.  Please set her up for a follow up appointment in April to discuss this and to check her vitamin D levels

## 2018-08-11 NOTE — Telephone Encounter (Signed)
Dr. Nolon Rod,   Erin Cruz states she has had Osteopenia for 25 years and has additional questions about the difference between her previous scans and the most recent.  Specifically, she would like to know if the Osteopenia is worsening.  After doing some research, she is unsure how helpful it would be to take Vitamin D/Calcium and would like to discuss this further with you.   An appointment was offered to go over these questions but Erin Cruz is not wanting to come in at this time.   Thank you,  Erin Cruz

## 2018-09-28 ENCOUNTER — Other Ambulatory Visit: Payer: Self-pay | Admitting: Family Medicine

## 2018-09-28 MED ORDER — FAMOTIDINE 20 MG PO TABS: 20.0000 mg | ORAL_TABLET | Freq: Two times a day (BID) | ORAL | 2 refills | Status: DC | PRN

## 2018-09-29 ENCOUNTER — Other Ambulatory Visit: Payer: Self-pay | Admitting: Family Medicine

## 2018-09-29 MED ORDER — FAMOTIDINE 20 MG PO TABS: 20.0000 mg | ORAL_TABLET | Freq: Two times a day (BID) | ORAL | 2 refills | Status: DC | PRN

## 2019-01-20 DIAGNOSIS — R69 Illness, unspecified: Secondary | ICD-10-CM | POA: Diagnosis not present

## 2019-03-05 DIAGNOSIS — R69 Illness, unspecified: Secondary | ICD-10-CM | POA: Diagnosis not present

## 2019-04-29 ENCOUNTER — Other Ambulatory Visit: Payer: Self-pay | Admitting: Family Medicine

## 2019-04-29 NOTE — Telephone Encounter (Signed)
Forwarding medication refill request to the clinical pool for review. 

## 2019-05-04 ENCOUNTER — Telehealth: Payer: Self-pay | Admitting: *Deleted

## 2019-05-04 NOTE — Telephone Encounter (Signed)
Sent to scheduling

## 2019-05-06 NOTE — Telephone Encounter (Signed)
Pt is being managed at another facility and refused appt

## 2019-05-26 DIAGNOSIS — K219 Gastro-esophageal reflux disease without esophagitis: Secondary | ICD-10-CM | POA: Diagnosis not present

## 2019-05-26 DIAGNOSIS — Z0000 Encounter for general adult medical examination without abnormal findings: Secondary | ICD-10-CM | POA: Diagnosis not present

## 2019-05-26 DIAGNOSIS — E7800 Pure hypercholesterolemia, unspecified: Secondary | ICD-10-CM | POA: Diagnosis not present

## 2019-05-26 DIAGNOSIS — Z1211 Encounter for screening for malignant neoplasm of colon: Secondary | ICD-10-CM | POA: Diagnosis not present

## 2019-06-10 DIAGNOSIS — Z0000 Encounter for general adult medical examination without abnormal findings: Secondary | ICD-10-CM | POA: Diagnosis not present

## 2019-06-10 DIAGNOSIS — E7800 Pure hypercholesterolemia, unspecified: Secondary | ICD-10-CM | POA: Diagnosis not present

## 2019-06-22 ENCOUNTER — Telehealth: Payer: Self-pay

## 2019-06-22 NOTE — Telephone Encounter (Signed)
NOTES ON FILE FROM [facility] [phone], SENT REFERRAL TO SCHEDULING 

## 2019-06-28 DIAGNOSIS — R69 Illness, unspecified: Secondary | ICD-10-CM | POA: Diagnosis not present

## 2019-06-29 DIAGNOSIS — Z1211 Encounter for screening for malignant neoplasm of colon: Secondary | ICD-10-CM | POA: Diagnosis not present

## 2019-07-01 ENCOUNTER — Ambulatory Visit: Payer: Medicare HMO

## 2019-07-23 ENCOUNTER — Ambulatory Visit: Payer: Medicare HMO

## 2019-07-31 ENCOUNTER — Ambulatory Visit: Payer: Medicare HMO | Attending: Internal Medicine

## 2019-07-31 DIAGNOSIS — Z23 Encounter for immunization: Secondary | ICD-10-CM | POA: Insufficient documentation

## 2019-07-31 NOTE — Progress Notes (Signed)
   Covid-19 Vaccination Clinic  Name:  Erin Cruz    MRN: [id number] DOB: Apr 08, 1946  07/31/2019  Erin Cruz was observed post Covid-19 immunization for 15 minutes without incidence. She was provided with Vaccine Information Sheet and instruction to access the V-Safe system.   Erin Cruz was instructed to call 911 with any severe reactions post vaccine: Marland Kitchen Difficulty breathing  . Swelling of your face and throat  . A fast heartbeat  . A bad rash all over your body  . Dizziness and weakness    Immunizations Administered    Name Date Dose VIS Date Route   Pfizer COVID-19 Vaccine 07/31/2019  5:35 PM 0.3 mL 06/04/2019 Intramuscular   Manufacturer: [place]   Lot: [id number]   [place]: [id number]

## 2019-08-05 ENCOUNTER — Ambulatory Visit: Payer: Medicare HMO | Admitting: Internal Medicine

## 2019-08-13 ENCOUNTER — Ambulatory Visit: Payer: Medicare HMO

## 2019-08-25 ENCOUNTER — Ambulatory Visit: Payer: Medicare HMO | Attending: Internal Medicine

## 2019-08-25 DIAGNOSIS — Z23 Encounter for immunization: Secondary | ICD-10-CM | POA: Insufficient documentation

## 2019-08-25 NOTE — Progress Notes (Signed)
   Covid-19 Vaccination Clinic  Name:  Erin Cruz    MRN: [id number] DOB: 1945/08/13  08/25/2019  Erin Cruz was observed post Covid-19 immunization for 15 minutes without incident. She was provided with Vaccine Information Sheet and instruction to access the V-Safe system.   Erin Cruz was instructed to call 911 with any severe reactions post vaccine: Marland Kitchen Difficulty breathing  . Swelling of face and throat  . A fast heartbeat  . A bad rash all over body  . Dizziness and weakness   Immunizations Administered    Name Date Dose VIS Date Route   Pfizer COVID-19 Vaccine 08/25/2019  2:39 PM 0.3 mL 06/04/2019 Intramuscular   Manufacturer: [place]   Lot: [id number]   [place]: [id number]

## 2019-08-30 ENCOUNTER — Ambulatory Visit: Payer: Medicare HMO | Admitting: Internal Medicine

## 2019-08-30 ENCOUNTER — Encounter: Payer: Self-pay | Admitting: Internal Medicine

## 2019-08-30 ENCOUNTER — Other Ambulatory Visit: Payer: Self-pay

## 2019-08-30 VITALS — BP 132/70 | HR 86 | Temp 95.9°F | Ht 61.5 in | Wt 133.0 lb

## 2019-08-30 DIAGNOSIS — M791 Myalgia, unspecified site: Secondary | ICD-10-CM | POA: Diagnosis not present

## 2019-08-30 DIAGNOSIS — Z8249 Family history of ischemic heart disease and other diseases of the circulatory system: Secondary | ICD-10-CM | POA: Diagnosis not present

## 2019-08-30 DIAGNOSIS — E7800 Pure hypercholesterolemia, unspecified: Secondary | ICD-10-CM | POA: Diagnosis not present

## 2019-08-30 DIAGNOSIS — T466X5A Adverse effect of antihyperlipidemic and antiarteriosclerotic drugs, initial encounter: Secondary | ICD-10-CM

## 2019-08-30 NOTE — Patient Instructions (Signed)
Medication Instructions:  NO CHANGES   Testing/Procedures: Dr. Debara Pickett has ordered a CT coronary calcium score. This test is done at [address]. This is $150 out of pocket.   Coronary CalciumScan A coronary calcium scan is an imaging test used to look for deposits of calcium and other fatty materials (plaques) in the inner lining of the blood vessels of the heart (coronary arteries). These deposits of calcium and plaques can partly clog and narrow the coronary arteries without producing any symptoms or warning signs. This puts a person at risk for a heart attack. This test can detect these deposits before symptoms develop. Tell a health care provider about:  Any allergies you have.  All medicines you are taking, including vitamins, herbs, eye drops, creams, and over-the-counter medicines.  Any problems you or family members have had with anesthetic medicines.  Any blood disorders you have.  Any surgeries you have had.  Any medical conditions you have.  Whether you are pregnant or may be pregnant. What are the risks? Generally, this is a safe procedure. However, problems may occur, including:  Harm to a pregnant woman and her unborn baby. This test involves the use of radiation. Radiation exposure can be dangerous to a pregnant woman and her unborn baby. If you are pregnant, you generally should not have this procedure done.  Slight increase in the risk of cancer. This is because of the radiation involved in the test. What happens before the procedure? No preparation is needed for this procedure. What happens during the procedure?  You will undress and remove any jewelry around your neck or chest.  You will put on a hospital gown.  Sticky electrodes will be placed on your chest. The electrodes will be connected to an electrocardiogram (ECG) machine to record a tracing of the electrical activity of your heart.  A CT scanner will take pictures of your heart.  During this time, you will be asked to lie still and hold your breath for 2-3 seconds while a picture of your heart is being taken. The procedure may vary among health care providers and hospitals. What happens after the procedure?  You can get dressed.  You can return to your normal activities.  It is up to you to get the results of your test. Ask your health care provider, or the department that is doing the test, when your results will be ready. Summary  A coronary calcium scan is an imaging test used to look for deposits of calcium and other fatty materials (plaques) in the inner lining of the blood vessels of the heart (coronary arteries).  Generally, this is a safe procedure. Tell your health care provider if you are pregnant or may be pregnant.  No preparation is needed for this procedure.  A CT scanner will take pictures of your heart.  You can return to your normal activities after the scan is done. This information is not intended to replace advice given to you by your health care provider. Make sure you discuss any questions you have with your health care provider. Document Released: 12/07/2007 Document Revised: 04/29/2016 Document Reviewed: 04/29/2016 Elsevier Interactive Patient Education  2017 [place]: At [facility], you and your health needs are our priority.  As part of our continuing mission to provide you with exceptional heart care, we have created designated Provider Care Teams.  These Care Teams include your primary Cardiologist (physician) and Advanced Practice Providers (APPs -  Physician Assistants and Nurse Practitioners) who all work together to provide you with the care you need, when you need it.  We recommend signing up for the patient portal called "MyChart".  Sign up information is provided on this After Visit Summary.  MyChart is used to connect with patients for Virtual Visits (Telemedicine).  Patients are able to view lab/test  results, encounter notes, upcoming appointments, etc.  Non-urgent messages can be sent to your provider as well.   To learn more about what you can do with MyChart, go to [email or web address].    Your next appointment:   1 month(s) - lipid clinic  The format for your next appointment:   Virtual Visit   Provider:   K. Mali Hilty, MD   Other Instructions

## 2019-08-30 NOTE — Progress Notes (Signed)
LIPID CLINIC CONSULT NOTE  Chief Complaint:  Manage dyslipidemia  Primary Care Physician: Pa, [place]  Primary Cardiologist:  No primary care provider on file.  HPI:  Erin Cruz is a 74 y.o. female who is being seen today for the evaluation of dyslipidemia at the request of Kara Pacer, MD. this is a 74 year old female kindly referred by [name] for evaluation management of dyslipidemia.  Ms. Erin Cruz has a longstanding history of dyslipidemia and family history of dyslipidemia primarily in her sister.  Her father had a history of coronary disease and 5 vessel CABG in his 20s when ultimately died of colon cancer in his 63s.  She has no known coronary disease.  She had previously been on lovastatin 20 mg daily in 2014 prescribed by Dr. [name] however the notes indicated it was discontinued due to muscle aches.  She was reportedly hesitant to try any different medication.  She was then ultimately transition to ezetimibe which she is taken for a number of years.  Her most recent lipid profile showed total cholesterol of 244, triglycerides 89, HDL 48 and LDL of 181.  PMHx:  Past Medical History:  Diagnosis Date  . Allergy    seasonal   . Arthritis    hand   . Cancer ([place]) 1998   neck basal cell  . Cataract    left side removed   . GERD (gastroesophageal reflux disease)   . Hyperlipidemia   . Osteopenia   . Raynaud phenomenon     Past Surgical History:  Procedure Laterality Date  . CATARACT EXTRACTION W/ INTRAOCULAR LENS IMPLANT Left 2004   left   . COLONOSCOPY     2008 [place], 2013 DB- normal   . CYSTECTOMY  2008   benign from neck   . EYE SURGERY    . HAND SURGERY  1965 & 1970   ganglion cyst x 2  . POPLITEAL SYNOVIAL CYST EXCISION  1961   left knee  . SPINE SURGERY  07/04/2014   L4 and L5 disc replacement and fusion L4-S1- Dr. Patrice Paradise  . TUBAL LIGATION  1980  . UMBILICAL HERNIA REPAIR  1979    FAMHx:  Family  History  Problem Relation Age of Onset  . Colon cancer Father   . Hyperlipidemia Father   . Leukemia Father        due to treatment for colon cancer  . Colon polyps Father   . Colon cancer Maternal Grandmother   . Muscular dystrophy Mother        [employer]  . Hypertension Sister   . Breast cancer Paternal Aunt   . Breast cancer Paternal Uncle   . Esophageal cancer Neg Hx   . Rectal cancer Neg Hx   . Stomach cancer Neg Hx     SOCHx:   reports that she has quit smoking. She has never used smokeless tobacco. She reports current alcohol use of about 2.0 standard drinks of alcohol per week. She reports that she does not use drugs.  ALLERGIES:  Allergies  Allergen Reactions  . Norco [Hydrocodone-Acetaminophen] Nausea And Vomiting  . Morphine And Related Other (See Comments)    Patient stated that she can take this but she has to take it with a nausea medication  . Statins Other (See Comments)    myalgias    ROS: Pertinent items noted in HPI and remainder of comprehensive ROS otherwise negative.  HOME MEDS: Current Outpatient Medications on File Prior to  Visit  Medication Sig Dispense Refill  . ezetimibe (ZETIA) 10 MG tablet Take 1 tablet (10 mg total) by mouth daily. 90 tablet 3  . famotidine (PEPCID) 20 MG tablet Take 1 tablet (20 mg total) by mouth 2 (two) times daily as needed for heartburn or indigestion. 90 tablet 2  . glucosamine-chondroitin 500-400 MG tablet Take 1 tablet by mouth 3 (three) times daily.    Marland Kitchen KRILL OIL PO Take 1 capsule by mouth 2 (two) times daily.    . naproxen (NAPROSYN) 500 MG tablet Take 1 tablet (500 mg total) by mouth 2 (two) times daily as needed for mild pain, moderate pain or headache (TAKE WITH MEALS.). 20 tablet 0   No current facility-administered medications on file prior to visit.    LABS/IMAGING: No results found for this or any previous visit (from the past 48 hour(s)). No results found.  LIPID PANEL:    Component Value Date/Time   CHOL  210 (H) 04/14/2018 0936   TRIG 81 04/14/2018 0936   HDL 47 04/14/2018 0936   CHOLHDL 4.5 (H) 04/14/2018 0936   CHOLHDL 6.3 (H) 03/19/2016 0933   VLDL 21 03/19/2016 0933   LDLCALC 147 (H) 04/14/2018 0936    WEIGHTS: Wt Readings from Last 3 Encounters:  08/30/19 133 lb (60.3 kg)  04/14/18 127 lb (57.6 kg)  04/18/17 140 lb 3.2 oz (63.6 kg)    VITALS: BP 132/70 (BP Location: Left Arm, Patient Position: Sitting, Cuff Size: Normal)   Pulse 86   Temp (!) 95.9 F (35.5 C)   Ht 5' 1.5" (1.562 m)   Wt 133 lb (60.3 kg)   BMI 24.72 kg/m   EXAM: General appearance: alert and no distress Neck: no carotid bruit, no JVD and thyroid not enlarged, symmetric, no tenderness/mass/nodules Lungs: clear to auscultation bilaterally Heart: regular rate and rhythm, S1, S2 normal, no murmur, click, rub or gallop Abdomen: soft, non-tender; bowel sounds normal; no masses,  no organomegaly Extremities: extremities normal, atraumatic, no cyanosis or edema Pulses: 2+ and symmetric Skin: Skin color, texture, turgor normal. No rashes or lesions Neurologic: Grossly normal Psych: Pleasant  EKG: Deferred  ASSESSMENT: 1. Mixed dyslipidemia 2. Family history of coronary disease 3. Myalgia on lovastatin  PLAN: 1.  [name] has a mixed dyslipidemia with high LDL cholesterol despite a reasonably healthy diet and regular exercise with an appropriate weight.  Her LDL is suggestive of a familial hyperlipidemia however there is no evidence for early onset heart disease.  There is heart disease in her family which was premature onset in her father and this also suggest familial hyperlipidemia.  She has a sister with similar lipid numbers however her overall health is not as good.  Currently she is maintained on ezetimibe however without that her LDL would be much higher than 200 suggestive of familial hyperlipidemia.  I would like to get a calcium score to see if there is any evidence of some premature coronary  disease.  This will also help [place] target her LDL goal.  I am not clear that she is really failed statin therapy rather she by report had some myalgias on lovastatin and did not allegedly want to take any other statin medications.  She would need to be tried on at least one high potency statin to see if it is well-tolerated and effective.  Thanks again for the kind referral.  Follow-up with me in 1 month virtually.  Pixie Casino, MD, [facility], [place]  [facility]  Medical Director of the [facility] Lipid [facility] &  Cardiovascular Risk Reduction Clinic Diplomate of the American Board of Clinical Lipidology Attending Cardiologist  Direct Dial: [phone]  Fax: [phone]  Website:  [email or web address] 08/30/2019, 3:57 PM

## 2019-09-06 ENCOUNTER — Other Ambulatory Visit: Payer: Self-pay

## 2019-09-06 ENCOUNTER — Ambulatory Visit (INDEPENDENT_AMBULATORY_CARE_PROVIDER_SITE_OTHER)
Admission: RE | Admit: 2019-09-06 | Discharge: 2019-09-06 | Disposition: A | Discharge status: Home / Self Care | Payer: Self-pay | Source: Ambulatory Visit | Attending: Internal Medicine | Admitting: Internal Medicine

## 2019-09-06 DIAGNOSIS — I700 Atherosclerosis of aorta: Secondary | ICD-10-CM | POA: Diagnosis not present

## 2019-09-06 DIAGNOSIS — E7800 Pure hypercholesterolemia, unspecified: Secondary | ICD-10-CM

## 2019-09-08 MED ORDER — ROSUVASTATIN CALCIUM 40 MG PO TABS: 40.0000 mg | ORAL_TABLET | Freq: Every day | ORAL | 3 refills | Status: DC

## 2019-10-01 ENCOUNTER — Telehealth: Payer: Medicare HMO | Admitting: Internal Medicine

## 2019-10-03 ENCOUNTER — Ambulatory Visit (HOSPITAL_COMMUNITY)
Admission: EM | Admit: 2019-10-03 | Discharge: 2019-10-03 | Disposition: A | Discharge status: Home / Self Care | Payer: Medicare HMO

## 2019-10-03 ENCOUNTER — Other Ambulatory Visit: Payer: Self-pay

## 2019-10-03 ENCOUNTER — Encounter (HOSPITAL_COMMUNITY): Payer: Self-pay

## 2019-10-03 DIAGNOSIS — E860 Dehydration: Secondary | ICD-10-CM

## 2019-10-03 DIAGNOSIS — R197 Diarrhea, unspecified: Secondary | ICD-10-CM | POA: Diagnosis not present

## 2019-10-03 MED ORDER — DIPHENOXYLATE-ATROPINE 2.5-0.025 MG PO TABS: 1.0000 | ORAL_TABLET | Freq: Three times a day (TID) | ORAL | 0 refills | Status: DC | PRN

## 2019-10-03 MED ORDER — DICYCLOMINE HCL 20 MG PO TABS: 20.0000 mg | ORAL_TABLET | Freq: Two times a day (BID) | ORAL | 0 refills | Status: DC

## 2019-10-03 NOTE — ED Triage Notes (Addendum)
Pt presents to UC with diarrhea x 3 days.Pt states she used the restroom at least 10 times at day, is worse during the day. Pt states she feels weak, as she have not be able to eat. Pt is drinking Pedialyte and taking imodium with somewhat relief. Pt states she had her 2nd COVID vaccine 3 weeks ago.

## 2019-10-03 NOTE — ED Provider Notes (Signed)
[place]    CSN: [id number] Arrival date & time: 10/03/19  1647      History   Chief Complaint Chief Complaint  Patient presents with  . Diarrhea    HPI Erin Cruz is a 74 y.o. female.   HPI Patient presents with a 3-day history of diarrhea without nausea vomiting or abdominal pain. She reports multiple stools throughout the day precipitated by some mild abdominal cramping. She has not had much of an appetite as food triggers diarrheal episodes. She has been sipping on Pedialyte and endorses poor fluid intake. She has received 2/2 COVID-19 vaccines, last vaccine dose 3 weeks ago. She has not had any sick contacts, fever, nausea, or vomiting. She has a history of GERD-which has been well controlled with omeprazole.  Past Medical History:  Diagnosis Date  . Allergy    seasonal   . Arthritis    hand   . Cancer ([place]) 1998   neck basal cell  . Cataract    left side removed   . GERD (gastroesophageal reflux disease)   . Hyperlipidemia   . Osteopenia   . Raynaud phenomenon     Patient Active Problem List   Diagnosis Date Noted  . BMI 25.0-25.9,adult 04/08/2017  . History of spinal stenosis 09/17/2016  . Deafness in right ear 03/19/2016  . Osteopenia   . GERD (gastroesophageal reflux disease)   . DDD (degenerative disc disease), lumbar 10/05/2013  . Pure hypercholesterolemia 04/08/2013  . Basal cell carcinoma (BCC) of neck 06/24/1996    Past Surgical History:  Procedure Laterality Date  . CATARACT EXTRACTION W/ INTRAOCULAR LENS IMPLANT Left 2004   left   . COLONOSCOPY     2008 [place], 2013 DB- normal   . CYSTECTOMY  2008   benign from neck   . EYE SURGERY    . HAND SURGERY  1965 & 1970   ganglion cyst x 2  . POPLITEAL SYNOVIAL CYST EXCISION  1961   left knee  . SPINE SURGERY  07/04/2014   L4 and L5 disc replacement and fusion L4-S1- Dr. Patrice Paradise  . TUBAL LIGATION  1980  . UMBILICAL HERNIA REPAIR  1979    OB History   No obstetric  history on file.      Home Medications    Prior to Admission medications   Medication Sig Start Date End Date Taking? Authorizing Provider  aspirin EC 81 MG tablet Take 81 mg by mouth daily.    [provider]  dicyclomine (BENTYL) 20 MG tablet Take 1 tablet (20 mg total) by mouth 2 (two) times daily before a meal. Take as needed for abdominal spasms 10/03/19   Scot Jun, FNP  diphenoxylate-atropine (LOMOTIL) 2.5-0.025 MG tablet Take 1 tablet by mouth 3 (three) times daily as needed for diarrhea or loose stools. 10/03/19   Scot Jun, FNP  ezetimibe (ZETIA) 10 MG tablet Take 1 tablet (10 mg total) by mouth daily. 04/14/18   Tenna Delaine D, PA-C  famotidine (PEPCID) 20 MG tablet Take 1 tablet (20 mg total) by mouth 2 (two) times daily as needed for heartburn or indigestion. 09/29/18   Forrest Moron, MD  glucosamine-chondroitin 500-400 MG tablet Take 1 tablet by mouth 3 (three) times daily.    [provider]  KRILL OIL PO Take 1 capsule by mouth 2 (two) times daily.    [provider]  naproxen (NAPROSYN) 500 MG tablet Take 1 tablet (500 mg total) by mouth 2 (two)  times daily as needed for mild pain, moderate pain or headache (TAKE WITH MEALS.). 02/07/15   Street, Mercedes, PA-C  rosuvastatin (CRESTOR) 40 MG tablet Take 1 tablet (40 mg total) by mouth daily. 09/08/19 12/07/19  Pixie Casino, MD    Family History Family History  Problem Relation Age of Onset  . Colon cancer Father   . Hyperlipidemia Father   . Leukemia Father        due to treatment for colon cancer  . Colon polyps Father   . Colon cancer Maternal Grandmother   . Muscular dystrophy Mother        [employer]  . Hypertension Sister   . Breast cancer Paternal Aunt   . Breast cancer Paternal Uncle   . Esophageal cancer Neg Hx   . Rectal cancer Neg Hx   . Stomach cancer Neg Hx     Social History Social History   Tobacco Use  . Smoking status: Former Research scientist (life sciences)  . Smokeless  tobacco: Never Used  . Tobacco comment: a couple cigarettes a day  Substance Use Topics  . Alcohol use: Yes    Alcohol/week: 2.0 standard drinks    Types: 2 Glasses of wine per week  . Drug use: No     Allergies   Norco [hydrocodone-acetaminophen], Morphine and related, and Statins   Review of Systems Review of Systems Pertinent negatives listed in HPI Physical Exam Triage Vital Signs ED Triage Vitals  Enc Vitals Group     BP 10/03/19 1705 136/84     Pulse Rate 10/03/19 1705 (!) 101     Resp 10/03/19 1705 18     Temp 10/03/19 1705 98.6 F (37 C)     Temp Source 10/03/19 1705 Oral     SpO2 10/03/19 1705 96 %     Weight --      Height --      Head Circumference --      Peak Flow --      Pain Score 10/03/19 1702 8     Pain Loc --      Pain Edu? --      Excl. in [place]? --    No data found.  Updated Vital Signs BP 136/84 (BP Location: Right Arm)   Pulse (!) 101   Temp 98.6 F (37 C) (Oral)   Resp 18   SpO2 96%   Visual Acuity Right Eye Distance:   Left Eye Distance:   Bilateral Distance:    Right Eye Near:   Left Eye Near:    Bilateral Near:     Physical Exam General appearance: alert, acutely ill-appearing, cooperative and without distress Head: Normocephalic, without obvious abnormality, atraumatic Respiratory: Respirations even and unlabored, normal respiratory rate Heart: rate and rhythm normal. No gallop or murmurs noted on exam  Abdomen: BS +, no distention, no rebound tenderness, or no mass Extremities: No gross deformities Skin: Skin color-pale.Texture, turgor normal. No rashes seen  Psych: Appropriate mood and affect. Neurologic: Alert, oriented to person, place, and time, thought content appropriate.  UC Treatments / Results  Labs (all labs ordered are listed, but only abnormal results are displayed) Labs Reviewed - No data to display  EKG   Radiology No results found.  Procedures Procedures (including critical care time)  Medications  Ordered in UC Medications - No data to display  Initial Impression / Assessment and Plan / UC Course  I have reviewed the triage vital signs and the nursing notes.  Pertinent labs & imaging  results that were available during my care of the patient were reviewed by me and considered in my medical decision making (see chart for details).    1. Diarrhea, unspecified type, unknown etiology, without abdominal pain -Abdominal exam negative for any tenderness, distension or bloating. Given age and duration diarrheal episodes encouraged patient to seek further evaluation in the ER with CT of abdomen. She opts to try more conservative measures and symptom management for another 24 hours before ER evaluation. Tolerated two cans of ginger-ale while in clinic without experiencing a stool.   -Trial Lomotil PRN ( 24 hours) if no improvement recommended ER evaluation for further work-up. -Trial Dicyclomine PRN prior to meals to reduce GI motility   2. Dehydration -Force fluids and try soups or broths -If unable to drink fluids without inducing multiple loose stools, go to ER for further evaluation and fluid hydration if warranted.  Red flags discussed.   Final Clinical Impressions(s) / UC Diagnoses   Final diagnoses:  Diarrhea, unspecified type  Dehydration     Discharge Instructions     Continue to force fluids which ever fluids were tolerable. Take medication as prescribed.  If your diarrheal episodes continue throughout tomorrow even with this medication I would recommend evaluation in the ER for further work-up of what may be the source of symptoms. Try to eat breads, crackers, jello, soups, light small meals until diarrhea episodes subside.    ED Prescriptions    Medication Sig Dispense Auth. Provider   diphenoxylate-atropine (LOMOTIL) 2.5-0.025 MG tablet  (Status: Discontinued) Take 1 tablet by mouth 3 (three) times daily as needed for diarrhea or loose stools. 30 tablet Scot Jun, FNP   dicyclomine (BENTYL) 20 MG tablet  (Status: Discontinued) Take 1 tablet (20 mg total) by mouth 2 (two) times daily before a meal. Take as needed for abdominal spasms 20 tablet Scot Jun, FNP   dicyclomine (BENTYL) 20 MG tablet Take 1 tablet (20 mg total) by mouth 2 (two) times daily before a meal. Take as needed for abdominal spasms 20 tablet Scot Jun, FNP   diphenoxylate-atropine (LOMOTIL) 2.5-0.025 MG tablet Take 1 tablet by mouth 3 (three) times daily as needed for diarrhea or loose stools. 15 tablet Scot Jun, FNP     PDMP not reviewed this encounter.   Scot Jun, FNP 10/05/19 [phone]

## 2019-10-03 NOTE — Discharge Instructions (Signed)
Continue to force fluids which ever fluids were tolerable. Take medication as prescribed.  If your diarrheal episodes continue throughout tomorrow even with this medication I would recommend evaluation in the ER for further work-up of what may be the source of symptoms. Try to eat breads, crackers, jello, soups, light small meals until diarrhea episodes subside.

## 2019-10-05 DIAGNOSIS — K921 Melena: Secondary | ICD-10-CM | POA: Diagnosis not present

## 2019-10-05 DIAGNOSIS — R109 Unspecified abdominal pain: Secondary | ICD-10-CM | POA: Diagnosis not present

## 2019-10-05 DIAGNOSIS — R5383 Other fatigue: Secondary | ICD-10-CM | POA: Diagnosis not present

## 2019-11-08 DIAGNOSIS — R69 Illness, unspecified: Secondary | ICD-10-CM | POA: Diagnosis not present

## 2019-11-29 ENCOUNTER — Encounter: Payer: Self-pay | Admitting: Internal Medicine

## 2019-11-29 ENCOUNTER — Telehealth (INDEPENDENT_AMBULATORY_CARE_PROVIDER_SITE_OTHER): Payer: Medicare HMO | Admitting: Internal Medicine

## 2019-11-29 VITALS — Wt 128.0 lb

## 2019-11-29 DIAGNOSIS — T466X5A Adverse effect of antihyperlipidemic and antiarteriosclerotic drugs, initial encounter: Secondary | ICD-10-CM | POA: Diagnosis not present

## 2019-11-29 DIAGNOSIS — E7800 Pure hypercholesterolemia, unspecified: Secondary | ICD-10-CM | POA: Diagnosis not present

## 2019-11-29 DIAGNOSIS — Z8249 Family history of ischemic heart disease and other diseases of the circulatory system: Secondary | ICD-10-CM | POA: Diagnosis not present

## 2019-11-29 DIAGNOSIS — R931 Abnormal findings on diagnostic imaging of heart and coronary circulation: Secondary | ICD-10-CM

## 2019-11-29 DIAGNOSIS — M791 Myalgia, unspecified site: Secondary | ICD-10-CM

## 2019-11-29 NOTE — Progress Notes (Signed)
Virtual Visit via Video Note   This visit type was conducted due to national recommendations for restrictions regarding the COVID-19 Pandemic (e.g. social distancing) in an effort to limit this patient's exposure and mitigate transmission in our community.  Due to her co-morbid illnesses, this patient is at least at moderate risk for complications without adequate follow up.  This format is felt to be most appropriate for this patient at this time.  All issues noted in this document were discussed and addressed.  A limited physical exam was performed with this format.  Please refer to the patient's chart for her consent to telehealth for [facility].   Evaluation Performed:  Caregility video visit  Date:  11/29/2019   ID:  Erin Cruz, DOB Aug 06, 1945, MRN [id number]  Patient Location:  [place] [address]  Provider location:   [address], [address] [place], [place] [address]  PCP:  [name]  Cardiologist:  No primary care provider on file. Electrophysiologist:  None   Chief Complaint:  Follow-up  History of Present Illness:    Erin Cruz is a 74 y.o. female who presents via audio/video conferencing for a telehealth visit today.  this is a 74 year old female kindly referred by [name] for evaluation management of dyslipidemia.  Ms. Erin Cruz has a longstanding history of dyslipidemia and family history of dyslipidemia primarily in her sister.  Her father had a history of coronary disease and 5 vessel CABG in his 79s when ultimately died of colon cancer in his 21s.  She has no known coronary disease.  She had previously been on lovastatin 20 mg daily in 2014 prescribed by Dr. [name] however the notes indicated it was discontinued due to muscle aches.  She was reportedly hesitant to try any different medication.  She was then ultimately transition to ezetimibe which she is taken for a number of years.  Her most recent  lipid profile showed total cholesterol of 244, triglycerides 89, HDL 48 and LDL of 181.  11/29/2019  [name] returns today for follow-up.  She underwent coronary calcium scoring on September 06, 2019 which demonstrated coronary calcification primarily in the LAD and circumflex arteries.  The calcium score was very high at 707, this was 94th percentile for age and sex matched control.  I discussed the findings with her today and she said she was quite surprised that the findings.  She says she is done a good job she thought of eating healthy and exercising regularly, however genetics are significant in her father who had bypass surgery.  I think that her degree of dyslipidemia that she has is clearly genetic and aggressive modification is necessary.  When the results came back abnormal, I had recommended starting aspirin 81 mg daily which she did and has been taking.  I also started her on high potency rosuvastatin.  Fortunately she has been tolerating it now for several months and we will need to get a repeat lipid profile.  She did stop her ezetimibe, which we had not instructed her to do, but she felt that the simvastatin was supposed to take the place of the ezetimibe.  The patient does not have symptoms concerning for COVID-19 infection (fever, chills, cough, or new SHORTNESS OF BREATH).    Prior CV studies:   The following studies were reviewed today:  Chart reviewed  PMHx:  Past Medical History:  Diagnosis Date  . Allergy    seasonal   . Arthritis  hand   . 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 Cataract    left side removed   . GERD (gastroesophageal reflux disease)   . Hyperlipidemia   . Osteopenia   . Raynaud phenomenon     Past Surgical History:  Procedure Laterality Date  . CATARACT EXTRACTION W/ INTRAOCULAR LENS IMPLANT Left 2004   left   . COLONOSCOPY     [place], 2013 [place]- normal   . CYSTECTOMY  2008   benign from neck   . EYE SURGERY    . HAND SURGERY   1965 & 1970   ganglion cyst x 2  . POPLITEAL SYNOVIAL CYST EXCISION  1961   left knee  . SPINE SURGERY  07/04/2014   L4 and L5 disc replacement and fusion L4-S1- Dr. Patrice Paradise  . TUBAL LIGATION  1980  . UMBILICAL HERNIA REPAIR  1979    FAMHx:  Family History  Problem Relation Age of Onset  . Colon cancer Father   . Hyperlipidemia Father   . Leukemia Father        due to treatment for colon cancer  . Colon polyps Father   . Colon cancer Maternal Grandmother   . Muscular dystrophy Mother        [employer]  . Hypertension Sister   . Breast cancer Paternal Aunt   . Breast cancer Paternal Uncle   . Esophageal cancer Neg Hx   . Rectal cancer Neg Hx   . Stomach cancer Neg Hx     SOCHx:   reports that she has quit smoking. She has never used smokeless tobacco. She reports current alcohol use of about 2.0 standard drinks of alcohol per week. She reports that she does not use drugs.  ALLERGIES:  Allergies  Allergen Reactions  . Norco [Hydrocodone-Acetaminophen] Nausea And Vomiting  . Morphine And Related Other (See Comments)    Patient stated that she can take this but she has to take it with a nausea medication  . Statins Other (See Comments)    myalgias    MEDS:  Current Meds  Medication Sig  . aspirin EC 81 MG tablet Take 81 mg by mouth daily.  Marland Kitchen dicyclomine (BENTYL) 20 MG tablet Take 1 tablet (20 mg total) by mouth 2 (two) times daily before a meal. Take as needed for abdominal spasms  . diphenoxylate-atropine (LOMOTIL) 2.5-0.025 MG tablet Take 1 tablet by mouth 3 (three) times daily as needed for diarrhea or loose stools.  Marland Kitchen glucosamine-chondroitin 500-400 MG tablet Take 1 tablet by mouth 3 (three) times daily.  Marland Kitchen KRILL OIL PO Take 1 capsule by mouth 2 (two) times daily.  . naproxen (NAPROSYN) 500 MG tablet Take 1 tablet (500 mg total) by mouth 2 (two) times daily as needed for mild pain, moderate pain or headache (TAKE WITH MEALS.).  Marland Kitchen rosuvastatin (CRESTOR) 40 MG tablet Take 1  tablet (40 mg total) by mouth daily.     ROS: Pertinent items noted in HPI and remainder of comprehensive ROS otherwise negative.  Labs/Other Tests and Data Reviewed:    Recent Labs: No results found for requested labs within last 8760 hours.   Recent Lipid Panel Lab Results  Component Value Date/Time   CHOL 210 (H) 04/14/2018 09:36 AM   TRIG 81 04/14/2018 09:36 AM   HDL 47 04/14/2018 09:36 AM   CHOLHDL 4.5 (H) 04/14/2018 09:36 AM   CHOLHDL 6.3 (H) 03/19/2016 09:33 AM   LDLCALC 147 (H) 04/14/2018 09:36 AM  Wt Readings from Last 3 Encounters:  11/29/19 128 lb (58.1 kg)  08/30/19 133 lb (60.3 kg)  04/14/18 127 lb (57.6 kg)     Exam:    Vital Signs:  Wt 128 lb (58.1 kg)   BMI 23.79 kg/m    General appearance: alert and no distress Lungs: Normal breathing Abdomen: Normal weight Extremities: extremities normal, atraumatic, no cyanosis or edema Skin: Skin color, texture, turgor normal. No rashes or lesions Neurologic: Mental status: Alert, oriented, thought content appropriate Psych: Pleasant  ASSESSMENT & PLAN:    1. Mixed dyslipidemia 2. Family history of coronary disease 3. Myalgia on lovastatin  4. High CAC score of 700, LAD and LCx disease  Ms. Mckamey had a high calcium score but she remains asymptomatic.  Due to trial data from the ischemia trial, I think there is probably little evidence for asymptomatic stress testing.  If she were to have an abnormal view, this could lead to cardiac catheterization and possible intervention with unclear benefit as she is asymptomatic.  I have encouraged her to continue to exercise and remain on optimal medical therapy.  We will repeat her lipids to see the effect of her rosuvastatin and I suspect need to go back on the ezetimibe in addition to this to reach a target LDL less than 70.  Continue low-dose aspirin.  Plan follow-up with me in 6 months or sooner as necessary.  COVID-19 Education: The signs and symptoms of COVID-19  were discussed with the patient and how to seek care for testing (follow up with PCP or arrange E-visit).  The importance of social distancing was discussed today.  Patient Risk:   After full review of this patients clinical status, I feel that they are at least moderate risk at this time.  Time:   Today, I have spent 15 minutes with the patient with telehealth technology discussing this lipidemia, coronary artery calcium score, family history of coronary disease.     Medication Adjustments/Labs and Tests Ordered: Current medicines are reviewed at length with the patient today.  Concerns regarding medicines are outlined above.   Tests Ordered: Orders Placed This Encounter  Procedures  . Lipid panel    Medication Changes: No orders of the defined types were placed in this encounter.   Disposition:  in 6 month(s)  Pixie Casino, MD, [facility], [place] Director of the Advanced Lipid Disorders &  Cardiovascular Risk Reduction Clinic Diplomate of the American Board of Clinical Lipidology Attending Cardiologist  Direct Dial: [phone]  Fax: [phone]  Website:  [email or web address]  Pixie Casino, MD  11/29/2019 9:48 AM

## 2019-11-29 NOTE — Patient Instructions (Signed)
Medication Instructions:  Your physician recommends that you continue on your current medications as directed. Please refer to the Current Medication list given to you today.  *If you need a refill on your cardiac medications before your next appointment, please call your pharmacy*   Lab Work: FASTING lab work to check cholesterol   If you have labs (blood work) drawn today and your tests are completely normal, you will receive your results only by: Marland Kitchen MyChart Message (if you have MyChart) OR . A paper copy in the mail If you have any lab test that is abnormal or we need to change your treatment, we will call you to review the results.   Testing/Procedures: NONE   Follow-Up: At [facility], you and your health needs are our priority.  As part of our continuing mission to provide you with exceptional heart care, we have created designated Provider Care Teams.  These Care Teams include your primary Cardiologist (physician) and Advanced Practice Providers (APPs -  Physician Assistants and Nurse Practitioners) who all work together to provide you with the care you need, when you need it.  We recommend signing up for the patient portal called "MyChart".  Sign up information is provided on this After Visit Summary.  MyChart is used to connect with patients for Virtual Visits (Telemedicine).  Patients are able to view lab/test results, encounter notes, upcoming appointments, etc.  Non-urgent messages can be sent to your provider as well.   To learn more about what you can do with MyChart, go to [email or web address].    Your next appointment:   6 month(s) - lipid clinic  The format for your next appointment:   Either In Person or Virtual  Provider:   K. Mali Hilty, MD   Other Instructions

## 2020-01-20 ENCOUNTER — Telehealth: Payer: Self-pay | Admitting: *Deleted

## 2020-01-20 NOTE — Telephone Encounter (Signed)
Spoke to pt today about the [place] study. Pt would like to have her lipid redrawn before she makes a decision about the study. Message sent to Dr. Debara Pickett regarding  Getting an appointment to rechecking her  lipids.

## 2020-04-02 DIAGNOSIS — R69 Illness, unspecified: Secondary | ICD-10-CM | POA: Diagnosis not present

## 2020-04-06 DIAGNOSIS — M170 Bilateral primary osteoarthritis of knee: Secondary | ICD-10-CM | POA: Diagnosis not present

## 2020-05-04 DIAGNOSIS — Z0100 Encounter for examination of eyes and vision without abnormal findings: Secondary | ICD-10-CM | POA: Diagnosis not present

## 2020-05-04 DIAGNOSIS — H5202 Hypermetropia, left eye: Secondary | ICD-10-CM | POA: Diagnosis not present

## 2020-05-16 DIAGNOSIS — E7800 Pure hypercholesterolemia, unspecified: Secondary | ICD-10-CM | POA: Diagnosis not present

## 2020-05-17 LAB — LIPID PANEL
Chol/HDL Ratio: 3.8 ratio (ref 0.0–4.4)
Cholesterol, Total: 153 mg/dL (ref 100–199)
HDL: 40 mg/dL (ref >39)
LDL Chol Calc (NIH): 98 mg/dL (ref 0–99)
Triglycerides: 80 mg/dL (ref 0–149)
VLDL Cholesterol Cal: 15 mg/dL (ref 5–40)

## 2020-05-26 ENCOUNTER — Encounter: Payer: Self-pay | Admitting: Internal Medicine

## 2020-05-26 ENCOUNTER — Other Ambulatory Visit: Payer: Self-pay

## 2020-05-26 ENCOUNTER — Ambulatory Visit: Payer: Medicare HMO | Admitting: Internal Medicine

## 2020-05-26 VITALS — BP 132/72 | HR 66 | Ht 61.5 in | Wt 135.6 lb

## 2020-05-26 DIAGNOSIS — E785 Hyperlipidemia, unspecified: Secondary | ICD-10-CM

## 2020-05-26 DIAGNOSIS — Z8249 Family history of ischemic heart disease and other diseases of the circulatory system: Secondary | ICD-10-CM

## 2020-05-26 DIAGNOSIS — M791 Myalgia, unspecified site: Secondary | ICD-10-CM | POA: Diagnosis not present

## 2020-05-26 DIAGNOSIS — Z79899 Other long term (current) drug therapy: Secondary | ICD-10-CM | POA: Diagnosis not present

## 2020-05-26 DIAGNOSIS — R931 Abnormal findings on diagnostic imaging of heart and coronary circulation: Secondary | ICD-10-CM

## 2020-05-26 DIAGNOSIS — T466X5A Adverse effect of antihyperlipidemic and antiarteriosclerotic drugs, initial encounter: Secondary | ICD-10-CM | POA: Diagnosis not present

## 2020-05-26 MED ORDER — EZETIMIBE 10 MG PO TABS: 10.0000 mg | ORAL_TABLET | Freq: Every day | ORAL | 3 refills | Status: DC

## 2020-05-26 NOTE — Progress Notes (Signed)
LIPID CLINIC CONSULT NOTE  Chief Complaint:  Follow-up dyslipidemia  Primary Care Physician: [facility]  Primary Cardiologist:  No primary care provider on file.  HPI:  Erin Cruz is a 74 y.o. female who is being seen today for the evaluation of dyslipidemia at the request of Erin Pacer, Cruz. this is a 74 year old female kindly referred by Dr. Lindell Erin Cruz for evaluation management of dyslipidemia.  Ms. Erin Cruz has a longstanding history of dyslipidemia and family history of dyslipidemia primarily in her sister.  Her father had a history of coronary disease and 5 vessel CABG in his 36s when ultimately died of colon cancer in his 40s.  She has no known coronary disease.  She had previously been on lovastatin 20 mg daily in 2014 prescribed by Dr. Leward Cruz however the notes indicated it was discontinued due to muscle aches.  She was reportedly hesitant to try any different medication.  She was then ultimately transition to ezetimibe which she is taken for a number of years.  Her most recent lipid profile showed total cholesterol of 244, triglycerides 89, HDL 48 and LDL of 181.  05/26/2020  Ms. Erin Cruz returns today for follow-up.  Since I last saw her she did undergo coronary calcium scoring.  This demonstrated unfortunately significant coronary calcification with a score of 707, 94th percentile for age and sex matched controls.  She reports she still asymptomatic and remains very physically active, exercising regularly.  She has had a very good response however to rosuvastatin 40 mg daily.  She is tolerating this without significant side effects.  Total cholesterol has declined from 215 down to 153 with triglycerides 80, HDL 40 and LDL of 98.  While this is an excellent response, I think even more aggressive therapy would be necessary to target her LDL less than 70 given the high risk nature of her calcification and family history of heart disease.  Of note,  she was previously on ezetimibe however when she changed providers this fell off her medication list and was not renewed but she did tolerate it.  PMHx:  Past Medical History:  Diagnosis Date  . Allergy    seasonal   . Arthritis    hand   . 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 Cataract    left side removed   . GERD (gastroesophageal reflux disease)   . Hyperlipidemia   . Osteopenia   . Raynaud phenomenon     Past Surgical History:  Procedure Laterality Date  . CATARACT EXTRACTION W/ INTRAOCULAR LENS IMPLANT Left 2004   left   . COLONOSCOPY     2008 [place], 2013 [place]- normal   . CYSTECTOMY  2008   benign from neck   . EYE SURGERY    . HAND SURGERY  1965 & 1970   ganglion cyst x 2  . POPLITEAL SYNOVIAL CYST EXCISION  1961   left knee  . SPINE SURGERY  07/04/2014   L4 and L5 disc replacement and fusion L4-S1- Dr. Patrice Paradise  . TUBAL LIGATION  1980  . UMBILICAL HERNIA REPAIR  1979    FAMHx:  Family History  Problem Relation Age of Onset  . Colon cancer Father   . Hyperlipidemia Father   . Leukemia Father        due to treatment for colon cancer  . Colon polyps Father   . Colon cancer Maternal Grandmother   . Muscular dystrophy Mother  [employer]  . Hypertension Sister   . Breast cancer Paternal Aunt   . Breast cancer Paternal Uncle   . Esophageal cancer Neg Hx   . Rectal cancer Neg Hx   . Stomach cancer Neg Hx     SOCHx:   reports that she has quit smoking. She has never used smokeless tobacco. She reports current alcohol use of about 2.0 standard drinks of alcohol per week. She reports that she does not use drugs.  ALLERGIES:  Allergies  Allergen Reactions  . Norco [Hydrocodone-Acetaminophen] Nausea And Vomiting  . Morphine And Related Other (See Comments)    Patient stated that she can take this but she has to take it with a nausea medication    ROS: Pertinent items noted in HPI and remainder of comprehensive ROS otherwise negative.  HOME  MEDS: Current Outpatient Medications on File Prior to Visit  Medication Sig Dispense Refill  . aspirin EC 81 MG tablet Take 81 mg by mouth daily.    Marland Kitchen glucosamine-chondroitin 500-400 MG tablet Take 1 tablet by mouth 3 (three) times daily.    . naproxen (NAPROSYN) 500 MG tablet Take 1 tablet (500 mg total) by mouth 2 (two) times daily as needed for mild pain, moderate pain or headache (TAKE WITH MEALS.). 20 tablet 0  . rosuvastatin (CRESTOR) 40 MG tablet Take 1 tablet (40 mg total) by mouth daily. 90 tablet 3  . diphenoxylate-atropine (LOMOTIL) 2.5-0.025 MG tablet Take 1 tablet by mouth 3 (three) times daily as needed for diarrhea or loose stools. 15 tablet 0  . KRILL OIL PO Take 1 capsule by mouth 2 (two) times daily.     No current facility-administered medications on file prior to visit.    LABS/IMAGING: No results found for this or any previous visit (from the past 48 hour(s)). No results found.  LIPID PANEL:    Component Value Date/Time   CHOL 153 05/16/2020 0831   TRIG 80 05/16/2020 0831   HDL 40 05/16/2020 0831   CHOLHDL 3.8 05/16/2020 0831   CHOLHDL 6.3 (H) 03/19/2016 0933   VLDL 21 03/19/2016 0933   LDLCALC 98 05/16/2020 0831    WEIGHTS: Wt Readings from Last 3 Encounters:  05/26/20 135 lb 9.6 oz (61.5 kg)  11/29/19 128 lb (58.1 kg)  08/30/19 133 lb (60.3 kg)    VITALS: BP 132/72   Pulse 66   Ht 5' 1.5" (1.562 m)   Wt 135 lb 9.6 oz (61.5 kg)   BMI 25.21 kg/m   EXAM: Deferred  EKG: Deferred  ASSESSMENT: 1. Mixed dyslipidemia, goal LDL <70 2. CAC score of 707, 94th percentile 3. Family history of coronary disease 4. Myalgia on lovastatin  PLAN: 1.  Ms. Erin Cruz has a mixed dyslipidemia but is noted to have a significantly elevated coronary calcium score with her family history, necessitating more aggressive lipid-lowering.  I would target her LDL less than 70.  Although she has had a significant improvement on high potency rosuvastatin she is now just  below 100.  We will add back her ezetimibe which was well-tolerated and that should give an additional 20 to 25% reduction in her lipids to target her around 70 for LDL.  Plan repeat lipids in 3 months and follow-up with me in 6 months.  Pixie Casino, Cruz, [facility], [place] Director of the Advanced Lipid Disorders &  Cardiovascular Risk Reduction Clinic Diplomate of the American Board of Clinical Lipidology Attending Cardiologist  Direct  Dial: [phone]  Fax: [phone]  Website:  [email or web address] 05/26/2020, 9:47 AM

## 2020-05-26 NOTE — Patient Instructions (Addendum)
Medication Instructions:  START: ZETIA 10mg   *If you need a refill on your cardiac medications before your next appointment, please call your pharmacy*  Lab Work: FASTING LIPIDS (CHOLESTEROL) IN 3 MONTHS PLEASE RETURN FOR THIS  If you have labs (blood work) drawn today and your tests are completely normal, you will receive your results only by: Marland Kitchen MyChart Message (if you have MyChart) OR . A paper copy in the mail If you have any lab test that is abnormal or we need to change your treatment, we will call you to review the results.  Follow-Up: At [facility], you and your health needs are our priority.  As part of our continuing mission to provide you with exceptional heart care, we have created designated Provider Care Teams.  These Care Teams include your primary Cardiologist (physician) and Advanced Practice Providers (APPs -  Physician Assistants and Nurse Practitioners) who all work together to provide you with the care you need, when you need it.  We recommend signing up for the patient portal called "MyChart".  Sign up information is provided on this After Visit Summary.  MyChart is used to connect with patients for Virtual Visits (Telemedicine).  Patients are able to view lab/test results, encounter notes, upcoming appointments, etc.  Non-urgent messages can be sent to your provider as well.   To learn more about what you can do with MyChart, go to [email or web address].    Your next appointment:   3 month(s)  The format for your next appointment:   In Person  Provider:   K. Mali Hilty, MD

## 2020-06-19 DIAGNOSIS — M170 Bilateral primary osteoarthritis of knee: Secondary | ICD-10-CM | POA: Diagnosis not present

## 2020-06-27 DIAGNOSIS — Z0000 Encounter for general adult medical examination without abnormal findings: Secondary | ICD-10-CM | POA: Diagnosis not present

## 2020-06-27 DIAGNOSIS — Z1211 Encounter for screening for malignant neoplasm of colon: Secondary | ICD-10-CM | POA: Diagnosis not present

## 2020-06-27 DIAGNOSIS — R030 Elevated blood-pressure reading, without diagnosis of hypertension: Secondary | ICD-10-CM | POA: Diagnosis not present

## 2020-06-27 DIAGNOSIS — E7800 Pure hypercholesterolemia, unspecified: Secondary | ICD-10-CM | POA: Diagnosis not present

## 2020-06-27 DIAGNOSIS — M8589 Other specified disorders of bone density and structure, multiple sites: Secondary | ICD-10-CM | POA: Diagnosis not present

## 2020-06-27 DIAGNOSIS — K219 Gastro-esophageal reflux disease without esophagitis: Secondary | ICD-10-CM | POA: Diagnosis not present

## 2020-06-27 DIAGNOSIS — R931 Abnormal findings on diagnostic imaging of heart and coronary circulation: Secondary | ICD-10-CM | POA: Diagnosis not present

## 2020-06-29 DIAGNOSIS — Z1211 Encounter for screening for malignant neoplasm of colon: Secondary | ICD-10-CM | POA: Diagnosis not present

## 2020-07-08 ENCOUNTER — Other Ambulatory Visit: Payer: Self-pay | Admitting: Family Medicine

## 2020-07-08 DIAGNOSIS — M8580 Other specified disorders of bone density and structure, unspecified site: Secondary | ICD-10-CM

## 2020-07-08 DIAGNOSIS — Z1231 Encounter for screening mammogram for malignant neoplasm of breast: Secondary | ICD-10-CM

## 2020-08-22 ENCOUNTER — Other Ambulatory Visit: Payer: Self-pay

## 2020-08-22 ENCOUNTER — Ambulatory Visit
Admission: RE | Admit: 2020-08-22 | Discharge: 2020-08-22 | Disposition: A | Discharge status: Home / Self Care | Payer: Medicare HMO | Source: Ambulatory Visit | Attending: Family Medicine | Admitting: Family Medicine

## 2020-08-22 DIAGNOSIS — Z1231 Encounter for screening mammogram for malignant neoplasm of breast: Secondary | ICD-10-CM | POA: Diagnosis not present

## 2020-08-25 ENCOUNTER — Other Ambulatory Visit: Payer: Self-pay | Admitting: Family Medicine

## 2020-08-25 DIAGNOSIS — R928 Other abnormal and inconclusive findings on diagnostic imaging of breast: Secondary | ICD-10-CM

## 2020-08-28 DIAGNOSIS — E7800 Pure hypercholesterolemia, unspecified: Secondary | ICD-10-CM | POA: Diagnosis not present

## 2020-08-28 DIAGNOSIS — Z79899 Other long term (current) drug therapy: Secondary | ICD-10-CM | POA: Diagnosis not present

## 2020-08-29 LAB — LIPID PANEL
Chol/HDL Ratio: 2.9 ratio (ref 0.0–4.4)
Cholesterol, Total: 132 mg/dL (ref 100–199)
HDL: 45 mg/dL (ref >39)
LDL Chol Calc (NIH): 73 mg/dL (ref 0–99)
Triglycerides: 71 mg/dL (ref 0–149)
VLDL Cholesterol Cal: 14 mg/dL (ref 5–40)

## 2020-08-31 ENCOUNTER — Ambulatory Visit (INDEPENDENT_AMBULATORY_CARE_PROVIDER_SITE_OTHER): Payer: Medicare HMO | Admitting: Internal Medicine

## 2020-08-31 ENCOUNTER — Other Ambulatory Visit: Payer: Self-pay

## 2020-08-31 ENCOUNTER — Encounter: Payer: Self-pay | Admitting: Internal Medicine

## 2020-08-31 VITALS — BP 142/74 | HR 73 | Ht 62.0 in | Wt 135.0 lb

## 2020-08-31 DIAGNOSIS — M791 Myalgia, unspecified site: Secondary | ICD-10-CM | POA: Diagnosis not present

## 2020-08-31 DIAGNOSIS — R931 Abnormal findings on diagnostic imaging of heart and coronary circulation: Secondary | ICD-10-CM | POA: Diagnosis not present

## 2020-08-31 DIAGNOSIS — Z8249 Family history of ischemic heart disease and other diseases of the circulatory system: Secondary | ICD-10-CM | POA: Diagnosis not present

## 2020-08-31 DIAGNOSIS — T466X5D Adverse effect of antihyperlipidemic and antiarteriosclerotic drugs, subsequent encounter: Secondary | ICD-10-CM | POA: Diagnosis not present

## 2020-08-31 DIAGNOSIS — T466X5A Adverse effect of antihyperlipidemic and antiarteriosclerotic drugs, initial encounter: Secondary | ICD-10-CM

## 2020-08-31 DIAGNOSIS — E785 Hyperlipidemia, unspecified: Secondary | ICD-10-CM

## 2020-08-31 NOTE — Progress Notes (Signed)
LIPID CLINIC CONSULT NOTE  Chief Complaint:  Follow-up dyslipidemia  Primary Care Physician: [name]  Primary Cardiologist:  No primary care provider on file.  HPI:  Erin Cruz is a 75 y.o. female who is being seen today for the evaluation of dyslipidemia at the request of Kara Pacer, MD. this is a 75 year old female kindly referred by [name] for evaluation management of dyslipidemia.  Erin Cruz has a longstanding history of dyslipidemia and family history of dyslipidemia primarily in her sister.  Her father had a history of coronary disease and 5 vessel CABG in his 67s when ultimately died of colon cancer in his 51s.  She has no known coronary disease.  She had previously been on lovastatin 20 mg daily in 2014 prescribed by Dr. [name] however the notes indicated it was discontinued due to muscle aches.  She was reportedly hesitant to try any different medication.  She was then ultimately transition to ezetimibe which she is taken for a number of years.  Her most recent lipid profile showed total cholesterol of 244, triglycerides 89, HDL 48 and LDL of 181.  05/26/2020  Erin Cruz returns today for follow-up.  Since I last saw her she did undergo coronary calcium scoring.  This demonstrated unfortunately significant coronary calcification with a score of 707, 94th percentile for age and sex matched controls.  She reports she still asymptomatic and remains very physically active, exercising regularly.  She has had a very good response however to rosuvastatin 40 mg daily.  She is tolerating this without significant side effects.  Total cholesterol has declined from 215 down to 153 with triglycerides 80, HDL 40 and LDL of 98.  While this is an excellent response, I think even more aggressive therapy would be necessary to target her LDL less than 70 given the high risk nature of her calcification and family history of heart disease.  Of note,  she was previously on ezetimibe however when she changed providers this fell off her medication list and was not renewed but she did tolerate it.  08/31/2020  Erin Cruz is seen today in follow-up.  Overall she continues to do well with the addition of ezetimibe.  Her cholesterol is significantly lower.  Total is now 132, triglycerides 71, HDL 45 and LDL of 73 down from 98 at her previous lab work in November.  She seems to be tolerating this well.  She did have a recent trip back to [place].  She said she was going up and down stairs a lot and when she came back had problems with her knees.  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  She wondered if it could be the statins however I am less inclined to think that is the case.  She denies any chest pain.  Although she had a high calcium score, she is asymptomatic and I think there is little role for stress testing in the situation.  We discussed the data from the ISCHEMIA trial which is guiding this.  PMHx:  Past Medical History:  Diagnosis Date  . Allergy    seasonal   . Arthritis    hand   . Cancer ([place]) 1998   neck basal cell  . Cataract    left side removed   . GERD (gastroesophageal reflux disease)   . Hyperlipidemia   . Osteopenia   . Raynaud phenomenon     Past Surgical History:  Procedure Laterality Date  .  CATARACT EXTRACTION W/ INTRAOCULAR LENS IMPLANT Left 2004   left   . COLONOSCOPY     [place], 2013 DB- normal   . CYSTECTOMY  2008   benign from neck   . EYE SURGERY    . HAND SURGERY  1965 & 1970   ganglion cyst x 2  . POPLITEAL SYNOVIAL CYST EXCISION  1961   left knee  . SPINE SURGERY  07/04/2014   L4 and L5 disc replacement and fusion L4-S1- Dr. Patrice Paradise  . TUBAL LIGATION  1980  . UMBILICAL HERNIA REPAIR  1979    FAMHx:  Family History  Problem Relation Age of Onset  . Colon cancer Father   . Hyperlipidemia Father   . Leukemia Father        due to treatment for  colon cancer  . Colon polyps Father   . Colon cancer Maternal Grandmother   . Muscular dystrophy Mother        [employer]  . Hypertension Sister   . Breast cancer Paternal Aunt   . Breast cancer Paternal Uncle   . Esophageal cancer Neg Hx   . Rectal cancer Neg Hx   . Stomach cancer Neg Hx     SOCHx:   reports that she has quit smoking. She has never used smokeless tobacco. She reports current alcohol use of about 2.0 standard drinks of alcohol per week. She reports that she does not use drugs.  ALLERGIES:  Allergies  Allergen Reactions  . Norco [Hydrocodone-Acetaminophen] Nausea And Vomiting  . Morphine And Related Other (See Comments)    Patient stated that she can take this but she has to take it with a nausea medication    ROS: Pertinent items noted in HPI and remainder of comprehensive ROS otherwise negative.  HOME MEDS: Current Outpatient Medications on File Prior to Visit  Medication Sig Dispense Refill  . aspirin EC 81 MG tablet Take 81 mg by mouth daily.    Marland Kitchen ezetimibe (ZETIA) 10 MG tablet Take 1 tablet (10 mg total) by mouth daily. 90 tablet 3  . glucosamine-chondroitin 500-400 MG tablet Take 1 tablet by mouth 3 (three) times daily.    . naproxen (NAPROSYN) 500 MG tablet Take 1 tablet (500 mg total) by mouth 2 (two) times daily as needed for mild pain, moderate pain or headache (TAKE WITH MEALS.). 20 tablet 0  . rosuvastatin (CRESTOR) 40 MG tablet Take 1 tablet (40 mg total) by mouth daily. 90 tablet 3   No current facility-administered medications on file prior to visit.    LABS/IMAGING: No results found for this or any previous visit (from the past 48 hour(s)). No results found.  LIPID PANEL:    Component Value Date/Time   CHOL 132 08/28/2020 0909   TRIG 71 08/28/2020 0909   HDL 45 08/28/2020 0909   CHOLHDL 2.9 08/28/2020 0909   CHOLHDL 6.3 (H) 03/19/2016 0933   VLDL 21 03/19/2016 0933   LDLCALC 73 08/28/2020 0909    WEIGHTS: Wt Readings from Last 3  Encounters:  08/31/20 135 lb (61.2 kg)  05/26/20 135 lb 9.6 oz (61.5 kg)  11/29/19 128 lb (58.1 kg)    VITALS: BP (!) 142/74   Pulse 73   Ht 5\' 2"  (1.575 m)   Wt 135 lb (61.2 kg)   SpO2 95%   BMI 24.69 kg/m   EXAM: General appearance: alert and no distress Neck: no carotid bruit, no JVD and thyroid not enlarged, symmetric, no tenderness/mass/nodules Lungs: clear to  auscultation bilaterally Heart: regular rate and rhythm, S1, S2 normal, no murmur, click, rub or gallop Abdomen: soft, non-tender; bowel sounds normal; no masses,  no organomegaly Extremities: extremities normal, atraumatic, no cyanosis or edema Pulses: 2+ and symmetric Skin: Skin color, texture, turgor normal. No rashes or lesions Neurologic: Grossly normal Psych: Pleasant  EKG: Deferred  ASSESSMENT: 1. Mixed dyslipidemia, goal LDL <70 2. CAC score of 707, 94th percentile 3. Family history of coronary disease 4. Myalgia on lovastatin  PLAN: 1.  Erin Cruz has now essentially reached a target LDL on ezetimibe and a statin.  She seems to be tolerating this well.  She does have a high coronary calcium score but is asymptomatic.  She is very physically active and exercises regularly without any symptoms.  Would recommend continue her current therapies.  Plan follow-up with me and repeat lipids in about 6 months  Pixie Casino, MD, [facility], [place] Director of the Advanced Lipid Disorders &  Cardiovascular Risk Reduction Clinic Diplomate of the American Board of Clinical Lipidology Attending Cardiologist  Direct Dial: [phone]  Fax: [phone]  Website:  [email or web address] 08/31/2020, 4:16 PM

## 2020-08-31 NOTE — Patient Instructions (Signed)
Medication Instructions:  Your physician recommends that you continue on your current medications as directed. Please refer to the Current Medication list given to you today.  *If you need a refill on your cardiac medications before your next appointment, please call your pharmacy*   Lab Work: FASTING lab work to check cholesterol in 6 months - complete about 1 week before your next visit  If you have labs (blood work) drawn today and your tests are completely normal, you will receive your results only by: Marland Kitchen MyChart Message (if you have MyChart) OR . A paper copy in the mail If you have any lab test that is abnormal or we need to change your treatment, we will call you to review the results.   Testing/Procedures: NONE   Follow-Up: At [facility], you and your health needs are our priority.  As part of our continuing mission to provide you with exceptional heart care, we have created designated Provider Care Teams.  These Care Teams include your primary Cardiologist (physician) and Advanced Practice Providers (APPs -  Physician Assistants and Nurse Practitioners) who all work together to provide you with the care you need, when you need it.  We recommend signing up for the patient portal called "MyChart".  Sign up information is provided on this After Visit Summary.  MyChart is used to connect with patients for Virtual Visits (Telemedicine).  Patients are able to view lab/test results, encounter notes, upcoming appointments, etc.  Non-urgent messages can be sent to your provider as well.   To learn more about what you can do with MyChart, go to [email or web address].    Your next appointment:   6 month(s) - lipid clinic  The format for your next appointment:   In Person  Provider:   K. Mali Hilty, MD   Other Instructions

## 2020-09-04 ENCOUNTER — Other Ambulatory Visit: Payer: Self-pay

## 2020-09-04 MED ORDER — ROSUVASTATIN CALCIUM 40 MG PO TABS: 40.0000 mg | ORAL_TABLET | Freq: Every day | ORAL | 3 refills | Status: DC

## 2020-09-04 NOTE — Telephone Encounter (Signed)
Rx(s) sent to pharmacy electronically.  

## 2020-09-08 ENCOUNTER — Ambulatory Visit
Admission: RE | Admit: 2020-09-08 | Discharge: 2020-09-08 | Disposition: A | Discharge status: Home / Self Care | Payer: Medicare HMO | Source: Ambulatory Visit | Attending: Family Medicine | Admitting: Family Medicine

## 2020-09-08 ENCOUNTER — Other Ambulatory Visit: Payer: Self-pay

## 2020-09-08 DIAGNOSIS — R928 Other abnormal and inconclusive findings on diagnostic imaging of breast: Secondary | ICD-10-CM

## 2020-09-08 DIAGNOSIS — N6489 Other specified disorders of breast: Secondary | ICD-10-CM | POA: Diagnosis not present

## 2020-09-08 DIAGNOSIS — R922 Inconclusive mammogram: Secondary | ICD-10-CM | POA: Diagnosis not present

## 2020-10-31 ENCOUNTER — Other Ambulatory Visit: Payer: Medicare HMO

## 2020-10-31 ENCOUNTER — Telehealth: Payer: Self-pay | Admitting: General Practice

## 2020-10-31 NOTE — Telephone Encounter (Signed)
Spoke with the patient. She stated that she has developed shortness of breath for the [ast week. The shortness of breath is when she ambulates on occasion. She was able to do her normal workout routine this morning. She denies swelling and chest pain. She did say that she feels palpitations when she becomes short of breath.   Appointment has been made for 5/11.

## 2020-10-31 NOTE — Telephone Encounter (Signed)
Pt reported SOB in pt scheduled. Scheduled her with Denyse Amass for tomorrow. Please contact pt to discuss symptoms prior to appt.

## 2020-11-01 ENCOUNTER — Ambulatory Visit: Payer: Medicare HMO | Admitting: General Practice

## 2020-11-01 ENCOUNTER — Other Ambulatory Visit: Payer: Self-pay

## 2020-11-01 ENCOUNTER — Encounter: Payer: Self-pay | Admitting: General Practice

## 2020-11-01 VITALS — BP 146/74 | HR 71 | Ht 62.0 in | Wt 132.2 lb

## 2020-11-01 DIAGNOSIS — R0609 Other forms of dyspnea: Secondary | ICD-10-CM

## 2020-11-01 DIAGNOSIS — E785 Hyperlipidemia, unspecified: Secondary | ICD-10-CM | POA: Diagnosis not present

## 2020-11-01 DIAGNOSIS — R0600 Dyspnea, unspecified: Secondary | ICD-10-CM

## 2020-11-01 DIAGNOSIS — R0602 Shortness of breath: Secondary | ICD-10-CM | POA: Diagnosis not present

## 2020-11-01 NOTE — Patient Instructions (Signed)
Medication Instructions:  The current medical regimen is effective;  continue present plan and medications as directed. Please refer to the Current Medication list given to you today.  *If you need a refill on your cardiac medications before your next appointment, please call your pharmacy*  Testing/Procedures:  Echocardiogram - Your physician has requested that you have an echocardiogram. Echocardiography is a painless test that uses sound waves to create images of your heart. It provides your doctor with information about the size and shape of your heart and how well your heart's chambers and valves are working. This procedure takes approximately one hour. There are no restrictions for this procedure. This will be performed at our [facility] location - [address].  Special Instructions Take and log your blood pressure daily at least 1 hour after taking your medicine. Make sure to rest for at least 5 minutes before taking also.  Follow-Up: Your next appointment:  1 month(s) In Person with K. Mali Hilty, MD OR IF UNAVAILABLE JESSE CLEAVER, FNP-C  At [facility], you and your health needs are our priority.  As part of our continuing mission to provide you with exceptional heart care, we have created designated Provider Care Teams.  These Care Teams include your primary Cardiologist (physician) and Advanced Practice Providers (APPs -  Physician Assistants and Nurse Practitioners) who all work together to provide you with the care you need, when you need it.            6 SALTY THINGS TO AVOID     1,800MG  DAILY

## 2020-11-01 NOTE — Progress Notes (Signed)
Cardiology Clinic Note   Patient Name: Erin Cruz Date of Encounter: 11/01/2020  Primary Care Provider:  [name] Primary Cardiologist:  Pixie Casino, MD  Patient Profile    Erin Cruz 75 year old female presents to the clinic today for evaluation of her shortness of breath.  Past Medical History    Past Medical History:  Diagnosis Date  . Allergy    seasonal   . Arthritis    hand   . Cancer ([place]) 1998   neck basal cell  . Cataract    left side removed   . GERD (gastroesophageal reflux disease)   . Hyperlipidemia   . Osteopenia   . Raynaud phenomenon    Past Surgical History:  Procedure Laterality Date  . CATARACT EXTRACTION W/ INTRAOCULAR LENS IMPLANT Left 2004   left   . COLONOSCOPY     [place], 2013 DB- normal   . CYSTECTOMY  2008   benign from neck   . EYE SURGERY    . HAND SURGERY  1965 & 1970   ganglion cyst x 2  . POPLITEAL SYNOVIAL CYST EXCISION  1961   left knee  . SPINE SURGERY  07/04/2014   L4 and L5 disc replacement and fusion L4-S1- Dr. Patrice Paradise  . TUBAL LIGATION  1980  . UMBILICAL HERNIA REPAIR  1979    Allergies  Allergies  Allergen Reactions  . Norco [Hydrocodone-Acetaminophen] Nausea And Vomiting  . Morphine And Related Other (See Comments)    Patient stated that she can take this but she has to take it with a nausea medication    History of Present Illness    Erin Cruz has a PMH of GERD, DDD, basal cell carcinoma of the neck, spinal stenosis, pure hypercholesterolemia, and BMI 25- 29.5.  She was initially referred for management of her cholesterol.  She reported she had a long family history of dyslipidemia.  Her father was diagnosed with coronary disease and underwent 5 vessel CABG in his 26s.  She does not have known CAD.  She was previously on lovastatin 20 mg in 2014.  This was discontinued due to muscle aches.  She was hesitant to try other cholesterol medications.  She did try  Zetia.  She has relocated to [facility] from [place].  She was seen by Dr. Debara Pickett on 05/26/2020.  She underwent coronary calcium scoring which showed a coronary calcium score of 707 and placed her in the 94th percentile for age and sex matched controls.  She reported she was asymptomatic and remained very physically active with regular exercise.  She had been placed on rosuvastatin 40 mg daily and was tolerating the medication without side effects.  Her total cholesterol decreased from 215-153 with triglycerides 80, HDL 40, and LDL 98.  Due to her family history of heart disease it was felt that her LDL goal should be less than 70.  She was restarted on ezetimibe.  She was seen by Dr. Debara Pickett on 08/31/2020.  During that time she reported she was doing well and tolerating ezetimibe.  Her lipid panel showed a total cholesterol of 132, triglycerides 71, HDL 45, LDL 73 which was down from 98.  She reported she has been more active going up and down stairs and noticed pain with her knees.  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  She wondered if it could be result of her statin therapy.  It was not felt that her statin is  contributing to her knee pain.  She denied chest pain.  No ischemic evaluation was felt to be needed.   She contacted the nurse triage line on 10/31/2020 and reported that she was having shortness of breath.  She presents to the clinic today for evaluation and states she has noticed increased work of breathing over the last month.  She reports noticing increased dyspnea with her normal exercise activities.  She remains very physically active doing group exercise for 45 minutes at least 3 times a week and walking.  She reports that she does have some seasonal allergies in the spring and winter.  She denies sick contacts fever and cough at this time.  Her blood pressure is slightly elevated in the office today.  I will order an echocardiogram, have her maintain a  blood pressure log, have her maintain her physical activity, and give her the salty 6 diet sheet.  We will have her follow-up after echocardiogram.  Today she denies chest pain,  lower extremity edema, fatigue, palpitations, melena, hematuria, hemoptysis, diaphoresis, weakness, presyncope, syncope, orthopnea, and PND.   Home Medications    Prior to Admission medications   Medication Sig Start Date End Date Taking? Authorizing Provider  aspirin EC 81 MG tablet Take 81 mg by mouth daily.    [provider]  ezetimibe (ZETIA) 10 MG tablet Take 1 tablet (10 mg total) by mouth daily. 05/26/20   Hilty, Nadean Corwin, MD  glucosamine-chondroitin 500-400 MG tablet Take 1 tablet by mouth 3 (three) times daily.    [provider]  naproxen (NAPROSYN) 500 MG tablet Take 1 tablet (500 mg total) by mouth 2 (two) times daily as needed for mild pain, moderate pain or headache (TAKE WITH MEALS.). 02/07/15   Street, Mercedes, PA-C  rosuvastatin (CRESTOR) 40 MG tablet Take 1 tablet (40 mg total) by mouth daily. 09/04/20 12/03/20  Pixie Casino, MD    Family History    Family History  Problem Relation Age of Onset  . Colon cancer Father   . Hyperlipidemia Father   . Leukemia Father        due to treatment for colon cancer  . Colon polyps Father   . Colon cancer Maternal Grandmother   . Muscular dystrophy Mother        [employer]  . Hypertension Sister   . Breast cancer Paternal Aunt   . Breast cancer Paternal Uncle   . Esophageal cancer Neg Hx   . Rectal cancer Neg Hx   . Stomach cancer Neg Hx    She indicated that her mother is deceased. She indicated that her father is deceased. She indicated that both of her sisters are alive. She indicated that her maternal grandmother is deceased. She indicated that her maternal grandfather is deceased. She indicated that her paternal grandmother is deceased. She indicated that her paternal grandfather is deceased. She indicated that her daughter is alive.  She indicated that her son is alive. She indicated that the status of her paternal aunt is unknown. She indicated that the status of her paternal uncle is unknown. She indicated that the status of her neg hx is unknown.  Social History    Social History   Socioeconomic History  . Marital status: Single    Spouse name: n/a  . Number of children: 2  . Years of education: Associate's Degree  . Highest education level: Not on file  Occupational History  . Occupation: retired    Comment: mortgage  Tobacco Use  .  Smoking status: Former Research scientist (life sciences)  . Smokeless tobacco: Never Used  . Tobacco comment: a couple cigarettes a day  Vaping Use  . Vaping Use: Never used  Substance and Sexual Activity  . Alcohol use: Yes    Alcohol/week: 2.0 standard drinks    Types: 2 Glasses of wine per week  . Drug use: No  . Sexual activity: Not Currently  Other Topics Concern  . Not on file  Social History Narrative   Exercise: Does exercise 7 days a week, classes 6 days a week and walking one day a week.   Diet: Loves vegetables, fruits, and quinoa. Chicken and fish. Yogurt, cheese, and almond milk. Drinks water.    Lives alone.   Daughter lives in [place].   Son lives in [place].   Social Determinants of Health   Financial Resource Strain: Not on file  Food Insecurity: Not on file  Transportation Needs: Not on file  Physical Activity: Not on file  Stress: Not on file  Social Connections: Not on file  Intimate Partner Violence: Not on file     Review of Systems    General:  No chills, fever, night sweats or weight changes.  Cardiovascular:  No chest pain, dyspnea on exertion, edema, orthopnea, palpitations, paroxysmal nocturnal dyspnea. Dermatological: No rash, lesions/masses Respiratory: No cough, dyspnea Urologic: No hematuria, dysuria Abdominal:   No nausea, vomiting, diarrhea, bright red blood per rectum, melena, or hematemesis Neurologic:  No visual changes, wkns, changes in mental  status. All other systems reviewed and are otherwise negative except as noted above.  Physical Exam    VS:  BP (!) 146/74 (BP Location: Left Arm, Patient Position: Sitting, Cuff Size: Normal)   Pulse 71   Ht 5\' 2"  (1.575 m)   Wt 132 lb 3.2 oz (60 kg)   SpO2 96%   BMI 24.18 kg/m  , BMI Body mass index is 24.18 kg/m. GEN: Well nourished, well developed, in no acute distress. HEENT: normal. Neck: Supple, no JVD, carotid bruits, or masses. Cardiac: RRR, no murmurs, rubs, or gallops. No clubbing, cyanosis, edema.  Radials/DP/PT 2+ and equal bilaterally.  Respiratory:  Respirations regular and unlabored, clear to auscultation bilaterally. GI: Soft, nontender, nondistended, BS + x 4. MS: no deformity or atrophy. Skin: warm and dry, no rash. Neuro:  Strength and sensation are intact. Psych: Normal affect.  Accessory Clinical Findings    Recent Labs: No results found for requested labs within last 8760 hours.   Recent Lipid Panel    Component Value Date/Time   CHOL 132 08/28/2020 0909   TRIG 71 08/28/2020 0909   HDL 45 08/28/2020 0909   CHOLHDL 2.9 08/28/2020 0909   CHOLHDL 6.3 (H) 03/19/2016 0933   VLDL 21 03/19/2016 0933   LDLCALC 73 08/28/2020 0909    ECG personally reviewed by me today-normal sinus rhythm no ST or T wave deviation 71 bpm- No acute changes  EKG 02/16/2015 Normal sinus rhythm 65 bpm no ST or T wave deviation.  Coronary calcium score 09/06/2019 TECHNIQUE: The patient was scanned on a [employer]. Axial non-contrast 3 mm slices were carried out through the heart. The data set was analyzed on a dedicated work station and scored using the [place].  FINDINGS: Non-cardiac: See separate report from [facility].  Ascending Aorta: Normal Caliber. Calcifications scattered throughout the ascending and descending aorta.  Pericardium: Normal  Coronary arteries: Normal coronary origins. Coronary calcifications noted in the LAD and  LCx.  IMPRESSION: Coronary calcium score  of 707. This was 94th percentile for age and sex matched control.  This correlates with a high risk for future cardiac events.  Aggressive risk factor modification and preventive therapy recommended.  Assessment & Plan   1.  Shortness of breath/DOE-reports over the past month she has had increased shortness of breath with routine daily activities.  She remains very physically active. Heart healthy low-sodium diet-salty 6 given Increase physical activity as tolerated Order echocardiogram Maintain blood pressure log  Hyperlipidemia-08/28/2020: Cholesterol, Total 132; HDL 45; LDL Chol Calc (NIH) 73; Triglycerides 71 Continue rosuvastatin, ezetimibe Heart healthy low-sodium high-fiber diet Increase physical activity as tolerated   Disposition: Follow-up with Dr. Debara Pickett or me in 1 month.   Jossie Ng. Camdyn Laden NP-C    11/01/2020, 9:46 AM [place] [address] Office [phone] Fax [phone]  Notice: This dictation was prepared with Dragon dictation along with smaller phrase technology. Any transcriptional errors that result from this process are unintentional and may not be corrected upon review.  I spent 13 minutes examining this patient, reviewing medications, and using patient centered shared decision making involving her cardiac care.  Prior to her visit I spent greater than 20 minutes reviewing her past medical history,  medications, and prior cardiac tests.

## 2020-11-29 ENCOUNTER — Ambulatory Visit (HOSPITAL_COMMUNITY): Payer: Medicare HMO | Attending: Cardiovascular Disease

## 2020-11-29 ENCOUNTER — Other Ambulatory Visit: Payer: Self-pay

## 2020-11-29 DIAGNOSIS — R0609 Other forms of dyspnea: Secondary | ICD-10-CM

## 2020-11-29 DIAGNOSIS — R0600 Dyspnea, unspecified: Secondary | ICD-10-CM | POA: Diagnosis not present

## 2020-11-29 LAB — ECHOCARDIOGRAM COMPLETE
Area-P 1/2: 2.77 cm2
MV M vel: 5.53 m/s
MV Peak grad: 122.3 mmHg
P 1/2 time: 386 msec
S' Lateral: 2.5 cm

## 2020-12-06 NOTE — Progress Notes (Signed)
Cardiology Clinic Note   Patient Name: Erin Cruz Date of Encounter: 12/08/2020  Primary Care Provider:  [name] Primary Cardiologist:  Pixie Casino, MD  Patient Profile    Erin Cruz 75 year old female presents to the clinic today for follow-up evaluation of her shortness of breath.  Past Medical History    Past Medical History:  Diagnosis Date   Allergy    seasonal    Arthritis    hand    Cancer ([place]) 1998   neck basal cell   Cataract    left side removed    GERD (gastroesophageal reflux disease)    Hyperlipidemia    Osteopenia    Raynaud phenomenon    Past Surgical History:  Procedure Laterality Date   CATARACT EXTRACTION W/ INTRAOCULAR LENS IMPLANT Left 2004   left    COLONOSCOPY     2008 [place], 2013 [place]- normal    CYSTECTOMY  2008   benign from neck    [place]   ganglion cyst x 2   POPLITEAL SYNOVIAL CYST EXCISION  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 Patrice Paradise   TUBAL LIGATION  3149   UMBILICAL HERNIA REPAIR  1979    Allergies  Allergies  Allergen Reactions   Norco [Hydrocodone-Acetaminophen] Nausea And Vomiting   Morphine And Related Other (See Comments)    Patient stated that she can take this but she has to take it with a nausea medication    History of Present Illness    Ms. Binette has a PMH of GERD, DDD, basal cell carcinoma of the neck, spinal stenosis, pure hypercholesterolemia, and BMI 25- 29.5.  She was initially referred for management of her cholesterol.  She reported she had a long family history of dyslipidemia.  Her father was diagnosed with coronary disease and underwent 5 vessel CABG in his 50s.  She does not have known CAD.  She was previously on lovastatin 20 mg in 2014.  This was discontinued due to muscle aches.  She was hesitant to try other cholesterol medications.  She did try Zetia.  She  has relocated to [facility] from [place].   She was seen by Dr. Debara Pickett on 05/26/2020.  She underwent coronary calcium scoring which showed a coronary calcium score of 707 and placed her in the 94th percentile for age and sex matched controls.  She reported she was asymptomatic and remained very physically active with regular exercise.  She had been placed on rosuvastatin 40 mg daily and was tolerating the medication without side effects.  Her total cholesterol decreased from 215-153 with triglycerides 80, HDL 40, and LDL 98.  Due to her family history of heart disease it was felt that her LDL goal should be less than 70.  She was restarted on ezetimibe.   She was seen by Dr. Debara Pickett on 08/31/2020.  During that time she reported she was doing well and tolerating ezetimibe.  Her lipid panel showed a total cholesterol of 132, triglycerides 71, HDL 45, LDL 73 which was down from 98.  She reported she has been more active going up and down stairs and noticed pain with her knees.  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  She wondered if it could be result of her statin therapy.  It was not felt that  her statin is contributing to her knee pain.  She denied chest pain.  No ischemic evaluation was felt to be needed.     She contacted the nurse triage line on 10/31/2020 and reported that she was having shortness of breath.   She presented to the clinic 11/01/2020 for evaluation and stated she had noticed increased work of breathing over the last month.  She reportd noticing increased dyspnea with her normal exercise activities.  She remained very physically active doing group exercise for 45 minutes at least 3 times a week and walking.  She reported that she did have some seasonal allergies in the spring and winter.  She denied sick contacts fever and cough at the time.  Her blood pressure was slightly elevated in the office .  I ordered an echocardiogram, had her maintain a blood  pressure log, had her maintain her physical activity, and gave her the salty 6 diet sheet.  We planned follow-up after echocardiogram.  Her echocardiogram showed an LVEF of 60-65%, G1 DD, moderate tricuspid valve regurgitation, mild-moderate aortic regurgitation, and borderline dilation of aortic root at 38 mm.  She presents today for follow-up evaluation and states she feels well.  She continues to notice sinus congestion related to her allergies.  She uses Nettie pot squeeze bottle and antihistamine which provided relief.  She reports these are intermittent throughout each season.  She brings a blood pressure log back today that shows blood pressures ranging in the 150s-130s over 60s-70s.  We reviewed her echocardiogram and I explained G1 DD and aortic dilation.  She expressed understanding.  She continues to be very physically active.  She does use some sodium in her diet.  I will give her the salty 6 diet sheet, have her maintain her physical activity, start HCTZ, maintain her blood pressure log, order a BMP in 1 week, have her follow-up in 6-8 months.   Today she denies chest pain,  lower extremity edema, fatigue, palpitations, melena, hematuria, hemoptysis, diaphoresis, weakness, presyncope, syncope, orthopnea, and PND.  Home Medications    Prior to Admission medications   Medication Sig Start Date End Date Taking? Authorizing Provider  aspirin EC 81 MG tablet Take 81 mg by mouth daily.    [provider]  BIOTIN 5000 PO Take by mouth.    [provider]  ezetimibe (ZETIA) 10 MG tablet Take 1 tablet (10 mg total) by mouth daily. 05/26/20   Hilty, Nadean Corwin, MD  glucosamine-chondroitin 500-400 MG tablet Take 1 tablet by mouth 3 (three) times daily.    [provider]  naproxen (NAPROSYN) 500 MG tablet Take 1 tablet (500 mg total) by mouth 2 (two) times daily as needed for mild pain, moderate pain or headache (TAKE WITH MEALS.). 02/07/15   Street, Mercedes, PA-C   omeprazole (PRILOSEC) 20 MG capsule 1 capsule 30 minutes before morning meal    [provider]  rosuvastatin (CRESTOR) 40 MG tablet Take 1 tablet (40 mg total) by mouth daily. 09/04/20 12/03/20  Hilty, Nadean Corwin, MD    Family History    Family History  Problem Relation Age of Onset   Colon cancer Father    Hyperlipidemia Father    Leukemia Father        due to treatment for colon cancer   Colon polyps Father    Colon cancer Maternal Grandmother    Muscular dystrophy Mother        [employer]   Hypertension Sister    Breast cancer Paternal  Aunt    Breast cancer Paternal Uncle    Esophageal cancer Neg Hx    Rectal cancer Neg Hx    Stomach cancer Neg Hx    She indicated that her mother is deceased. She indicated that her father is deceased. She indicated that both of her sisters are alive. She indicated that her maternal grandmother is deceased. She indicated that her maternal grandfather is deceased. She indicated that her paternal grandmother is deceased. She indicated that her paternal grandfather is deceased. She indicated that her daughter is alive. She indicated that her son is alive. She indicated that the status of her paternal aunt is unknown. She indicated that the status of her paternal uncle is unknown. She indicated that the status of her neg hx is unknown.  Social History    Social History   Socioeconomic History   Marital status: Single    Spouse name: n/a   Number of children: 2   Years of education: Associate's Degree   Highest education level: Not on file  Occupational History   Occupation: retired    Comment: mortgage  Tobacco Use   Smoking status: Former    Pack years: 0.00   Smokeless tobacco: Never   Tobacco comments:    a couple cigarettes a day  Scientific laboratory technician Use: Never used  Substance and Sexual Activity   Alcohol use: Yes    Alcohol/week: 2.0 standard drinks    Types: 2 Glasses of wine per week   Drug use: No   Sexual activity: Not  Currently  Other Topics Concern   Not on file  Social History Narrative   Exercise: Does exercise 7 days a week, classes 6 days a week and walking one day a week.   Diet: Loves vegetables, fruits, and quinoa. Chicken and fish. Yogurt, cheese, and almond milk. Drinks water.    Lives alone.   Daughter lives in [place].   Son lives in [place].   Social Determinants of Health   Financial Resource Strain: Not on file  Food Insecurity: Not on file  Transportation Needs: Not on file  Physical Activity: Not on file  Stress: Not on file  Social Connections: Not on file  Intimate Partner Violence: Not on file     Review of Systems    General:  No chills, fever, night sweats or weight changes.  Cardiovascular:  No chest pain, dyspnea on exertion, edema, orthopnea, palpitations, paroxysmal nocturnal dyspnea. Dermatological: No rash, lesions/masses Respiratory: No cough, dyspnea Urologic: No hematuria, dysuria Abdominal:   No nausea, vomiting, diarrhea, bright red blood per rectum, melena, or hematemesis Neurologic:  No visual changes, wkns, changes in mental status. All other systems reviewed and are otherwise negative except as noted above.  Physical Exam    VS:  BP 122/76 (BP Location: Left Arm, Patient Position: Sitting, Cuff Size: Normal)   Pulse 74   Ht 5\' 2"  (1.575 m)   Wt 129 lb 9.6 oz (58.8 kg)   SpO2 98%   BMI 23.70 kg/m  , BMI Body mass index is 23.7 kg/m. GEN: Well nourished, well developed, in no acute distress. HEENT: normal. Neck: Supple, no JVD, carotid bruits, or masses. Cardiac: RRR, no murmurs, rubs, or gallops. No clubbing, cyanosis, edema.  Radials/DP/PT 2+ and equal bilaterally.  Respiratory:  Respirations regular and unlabored, clear to auscultation bilaterally. GI: Soft, nontender, nondistended, BS + x 4. MS: no deformity or atrophy. Skin: warm and dry, no rash. Neuro:  Strength and sensation  are intact. Psych: Normal affect.  Accessory Clinical  Findings    Recent Labs: No results found for requested labs within last 8760 hours.   Recent Lipid Panel    Component Value Date/Time   CHOL 132 08/28/2020 0909   TRIG 71 08/28/2020 0909   HDL 45 08/28/2020 0909   CHOLHDL 2.9 08/28/2020 0909   CHOLHDL 6.3 (H) 03/19/2016 0933   VLDL 21 03/19/2016 0933   LDLCALC 73 08/28/2020 0909    ECG personally reviewed by me today-none today.  EKG 11/01/2020 normal sinus rhythm no ST or T wave deviation 71 bpm- No acute changes   EKG 02/16/2015 Normal sinus rhythm 65 bpm no ST or T wave deviation.   Coronary calcium score 09/06/2019  TECHNIQUE: The patient was scanned on a [employer]. Axial non-contrast 3 mm slices were carried out through the heart. The data set was analyzed on a dedicated work station and scored using the [place].   FINDINGS: Non-cardiac: See separate report from [facility].   Ascending Aorta: Normal Caliber. Calcifications scattered throughout the ascending and descending aorta.   Pericardium: Normal   Coronary arteries: Normal coronary origins. Coronary calcifications noted in the LAD and LCx.   IMPRESSION: Coronary calcium score of 707. This was 94th percentile for age and sex matched control.   This correlates with a high risk for future cardiac events.   Aggressive risk factor modification and preventive therapy recommended.  Echocardiogram 11/29/2020  IMPRESSIONS     1. Left ventricular ejection fraction, by estimation, is 60 to 65%. Left  ventricular ejection fraction by 3D volume is 76 %. The left ventricle has  normal function. The left ventricle has no regional wall motion  abnormalities. Left ventricular diastolic   parameters are consistent with Grade I diastolic dysfunction (impaired  relaxation).   2. Right ventricular systolic function is normal. The right ventricular  size is normal. There is normal pulmonary artery systolic pressure.   3. The mitral valve  is myxomatous. Mild to moderate mitral valve  regurgitation. No evidence of mitral stenosis.   4. The tricuspid valve is myxomatous. Tricuspid valve regurgitation is  moderate.   5. The aortic valve is normal in structure. Aortic valve regurgitation is  mild to moderate. No aortic stenosis is present.   6. There is borderline dilatation of the aortic root, measuring 38 mm.   7. The inferior vena cava is normal in size with greater than 50%  respiratory variability, suggesting right atrial pressure of 3 mmHg.   Assessment & Plan   1.  Shortness of breath/DOE-reports some improvement with her shortness of breath, varies with allergies.  She remains very physically active.  Echocardiogram showed an LVEF of 60-65%, G1 DD, moderate tricuspid valve regurgitation, mild-moderate aortic regurgitation, and borderline dilation of aortic root at 38 mm.Marland Kitchen   Heart healthy low-sodium diet-salty 6 given Increase physical activity as tolerated Continue to maintain blood pressure log Repeat echocardiogram 6/23-aortic dilation  Essential hypertension-BP today 122/76.  150s-130s over 70s. Heart healthy low-sodium diet Increase physical activity as tolerated Goal blood pressure 130/70 Start HCTZ 12.5 mg daily Order BMP in 1 week Maintain blood pressure log  Hyperlipidemia-08/28/2020: Cholesterol, Total 132; HDL 45; LDL Chol Calc (NIH) 73; Triglycerides 71 Continue rosuvastatin, ezetimibe Heart healthy low-sodium high-fiber diet Increase physical activity as tolerated     Disposition: Follow-up with Dr. Debara Pickett or me in 6-8 months.     Jossie Ng. Dauntae Derusha NP-C    12/08/2020, 10:11 AM [facility] [address] Office [phone] Fax [phone]  Notice: This dictation was prepared with Dragon dictation along with smaller phrase technology. Any transcriptional errors that result from this process are unintentional and may not be corrected upon review.  I spent 14  minutes examining this patient, reviewing medications, and using patient centered shared decision making involving her cardiac care.  Prior to her visit I spent greater than 20 minutes reviewing her past medical history,  medications, and prior cardiac tests.

## 2020-12-08 ENCOUNTER — Ambulatory Visit: Payer: Medicare HMO | Admitting: General Practice

## 2020-12-08 ENCOUNTER — Encounter: Payer: Self-pay | Admitting: General Practice

## 2020-12-08 ENCOUNTER — Other Ambulatory Visit: Payer: Self-pay

## 2020-12-08 VITALS — BP 122/76 | HR 74 | Ht 62.0 in | Wt 129.6 lb

## 2020-12-08 DIAGNOSIS — E7800 Pure hypercholesterolemia, unspecified: Secondary | ICD-10-CM | POA: Diagnosis not present

## 2020-12-08 DIAGNOSIS — Z79899 Other long term (current) drug therapy: Secondary | ICD-10-CM | POA: Diagnosis not present

## 2020-12-08 DIAGNOSIS — I10 Essential (primary) hypertension: Secondary | ICD-10-CM

## 2020-12-08 DIAGNOSIS — R0602 Shortness of breath: Secondary | ICD-10-CM

## 2020-12-08 DIAGNOSIS — R0600 Dyspnea, unspecified: Secondary | ICD-10-CM

## 2020-12-08 DIAGNOSIS — I77810 Thoracic aortic ectasia: Secondary | ICD-10-CM

## 2020-12-08 DIAGNOSIS — R0609 Other forms of dyspnea: Secondary | ICD-10-CM

## 2020-12-08 MED ORDER — HYDROCHLOROTHIAZIDE 12.5 MG PO CAPS: 12.5000 mg | ORAL_CAPSULE | Freq: Every day | ORAL | 8 refills | Status: DC

## 2020-12-08 NOTE — Patient Instructions (Signed)
Medication Instructions:  START HYDROCHLOROTHIAZIDE 12.5MG  DAILY *If you need a refill on your cardiac medications before your next appointment, please call your pharmacy*  Lab Work: BMET IN 1 WEEK-THIS IS NOT FASTING If you have labs (blood work) drawn today and your tests are completely normal, you will receive your results only by:  [place] (if you have MyChart) OR A paper copy in the mail.  If you have any lab test that is abnormal or we need to change your treatment, we will call you to review the results. You may go to any [facility] that is convenient for you however, we do have a lab in our office that is able to assist you. You DO NOT need an appointment for our lab. The lab is open 8:00am and closes at 4:00pm. Lunch 12:45 - 1:45pm.  Testing/Procedures: Echocardiogram (IN 1 YEAR 11-2021)- Your physician has requested that you have an echocardiogram. Echocardiography is a painless test that uses sound waves to create images of your heart. It provides your doctor with information about the size and shape of your heart and how well your heart's chambers and valves are working. This procedure takes approximately one hour. There are no restrictions for this procedure. This will be performed at our [facility] location - [address], [address].  Special Instructions TAKE AND LOG YOUR BLOOD PRESSURE  PLEASE READ AND FOLLOW SALTY 6-ATTACHED-1,800 mg daily  PLEASE MAINTAIN PHYSICAL ACTIVITY AS TOLERATED  Follow-Up: Your next appointment:  6-8 month(s) In Person with You may see Pixie Casino, MDOR IF UNAVAILABLE JESSE CLEAVER, FNP-C  or one of the following Advanced Practice Providers on your designated Care Team:  Almyra Deforest, PA-C  Fabian Sharp, [place] or Roby Lofts, PA-C   Please call our office 2 months in advance to schedule this appointment   At [facility], you and your health needs are our priority.  As part of our continuing mission to provide you with exceptional heart  care, we have created designated Provider Care Teams.  These Care Teams include your primary Cardiologist (physician) and Advanced Practice Providers (APPs -  Physician Assistants and Nurse Practitioners) who all work together to provide you with the care you need, when you need it.

## 2020-12-23 ENCOUNTER — Other Ambulatory Visit: Payer: Self-pay

## 2020-12-26 MED ORDER — ROSUVASTATIN CALCIUM 40 MG PO TABS: 40.0000 mg | ORAL_TABLET | Freq: Every day | ORAL | 3 refills | Status: DC

## 2020-12-27 DIAGNOSIS — Z79899 Other long term (current) drug therapy: Secondary | ICD-10-CM | POA: Diagnosis not present

## 2020-12-27 DIAGNOSIS — I77810 Thoracic aortic ectasia: Secondary | ICD-10-CM | POA: Diagnosis not present

## 2020-12-27 DIAGNOSIS — R0602 Shortness of breath: Secondary | ICD-10-CM | POA: Diagnosis not present

## 2020-12-27 DIAGNOSIS — R0600 Dyspnea, unspecified: Secondary | ICD-10-CM | POA: Diagnosis not present

## 2020-12-27 DIAGNOSIS — E7800 Pure hypercholesterolemia, unspecified: Secondary | ICD-10-CM | POA: Diagnosis not present

## 2020-12-27 DIAGNOSIS — I10 Essential (primary) hypertension: Secondary | ICD-10-CM | POA: Diagnosis not present

## 2020-12-28 LAB — BASIC METABOLIC PANEL
BUN/Creatinine Ratio: 13 (ref 12–28)
BUN: 11 mg/dL (ref 8–27)
CO2: 25 mmol/L (ref 20–29)
Calcium: 9.7 mg/dL (ref 8.7–10.3)
Chloride: 101 mmol/L (ref 96–106)
Creatinine, Ser: 0.88 mg/dL (ref 0.57–1.00)
Glucose: 85 mg/dL (ref 65–99)
Potassium: 4.2 mmol/L (ref 3.5–5.2)
Sodium: 141 mmol/L (ref 134–144)
eGFR: 70 mL/min/{1.73_m2} (ref >59)

## 2021-01-23 DIAGNOSIS — H25012 Cortical age-related cataract, left eye: Secondary | ICD-10-CM | POA: Diagnosis not present

## 2021-01-23 DIAGNOSIS — H2512 Age-related nuclear cataract, left eye: Secondary | ICD-10-CM | POA: Diagnosis not present

## 2021-01-23 DIAGNOSIS — H353131 Nonexudative age-related macular degeneration, bilateral, early dry stage: Secondary | ICD-10-CM | POA: Diagnosis not present

## 2021-01-23 DIAGNOSIS — H35372 Puckering of macula, left eye: Secondary | ICD-10-CM | POA: Diagnosis not present

## 2021-01-23 DIAGNOSIS — Z961 Presence of intraocular lens: Secondary | ICD-10-CM | POA: Diagnosis not present

## 2021-01-27 DIAGNOSIS — M79672 Pain in left foot: Secondary | ICD-10-CM | POA: Diagnosis not present

## 2021-02-19 DIAGNOSIS — H2512 Age-related nuclear cataract, left eye: Secondary | ICD-10-CM | POA: Diagnosis not present

## 2021-02-20 ENCOUNTER — Other Ambulatory Visit: Payer: Self-pay

## 2021-02-20 ENCOUNTER — Ambulatory Visit: Payer: Medicare HMO | Admitting: Internal Medicine

## 2021-02-20 VITALS — BP 112/68 | HR 64 | Ht 62.0 in | Wt 128.0 lb

## 2021-02-20 DIAGNOSIS — T466X5A Adverse effect of antihyperlipidemic and antiarteriosclerotic drugs, initial encounter: Secondary | ICD-10-CM

## 2021-02-20 DIAGNOSIS — I77810 Thoracic aortic ectasia: Secondary | ICD-10-CM | POA: Diagnosis not present

## 2021-02-20 DIAGNOSIS — R0609 Other forms of dyspnea: Secondary | ICD-10-CM

## 2021-02-20 DIAGNOSIS — R0600 Dyspnea, unspecified: Secondary | ICD-10-CM

## 2021-02-20 DIAGNOSIS — T466X5D Adverse effect of antihyperlipidemic and antiarteriosclerotic drugs, subsequent encounter: Secondary | ICD-10-CM | POA: Diagnosis not present

## 2021-02-20 DIAGNOSIS — I10 Essential (primary) hypertension: Secondary | ICD-10-CM | POA: Diagnosis not present

## 2021-02-20 DIAGNOSIS — R931 Abnormal findings on diagnostic imaging of heart and coronary circulation: Secondary | ICD-10-CM

## 2021-02-20 DIAGNOSIS — E785 Hyperlipidemia, unspecified: Secondary | ICD-10-CM | POA: Diagnosis not present

## 2021-02-20 DIAGNOSIS — M791 Myalgia, unspecified site: Secondary | ICD-10-CM

## 2021-02-20 NOTE — Progress Notes (Signed)
LIPID CLINIC CONSULT NOTE  Chief Complaint:  Follow-up dyslipidemia  Primary Care Physician: [name]  Primary Cardiologist:  Pixie Casino, MD  HPI:  Erin Cruz is a 75 y.o. female who is being seen today for the evaluation of dyslipidemia at the request of Kara Pacer, MD. this is a 75 year old female kindly referred by [name] for evaluation management of dyslipidemia.  Ms. Erin Cruz has a longstanding history of dyslipidemia and family history of dyslipidemia primarily in her sister.  Her father had a history of coronary disease and 5 vessel CABG in his 90s when ultimately died of colon cancer in his 79s.  She has no known coronary disease.  She had previously been on lovastatin 20 mg daily in 2014 prescribed by Dr. [name] however the notes indicated it was discontinued due to muscle aches.  She was reportedly hesitant to try any different medication.  She was then ultimately transition to ezetimibe which she is taken for a number of years.  Her most recent lipid profile showed total cholesterol of 244, triglycerides 89, HDL 48 and LDL of 181.  05/26/2020  Ms. [name] returns today for follow-up.  Since I last saw her she did undergo coronary calcium scoring.  This demonstrated unfortunately significant coronary calcification with a score of 707, 94th percentile for age and sex matched controls.  She reports she still asymptomatic and remains very physically active, exercising regularly.  She has had a very good response however to rosuvastatin 40 mg daily.  She is tolerating this without significant side effects.  Total cholesterol has declined from 215 down to 153 with triglycerides 80, HDL 40 and LDL of 98.  While this is an excellent response, I think even more aggressive therapy would be necessary to target her LDL less than 70 given the high risk nature of her calcification and family history of heart disease.  Of note, she was  previously on ezetimibe however when she changed providers this fell off her medication list and was not renewed but she did tolerate it.  08/31/2020  Mrs. Erin Cruz is seen today in follow-up.  Overall she continues to do well with the addition of ezetimibe.  Her cholesterol is significantly lower.  Total is now 132, triglycerides 71, HDL 45 and LDL of 73 down from 98 at her previous lab work in November.  She seems to be tolerating this well.  She did have a recent trip back to [place].  She said she was going up and down stairs a lot and when she came back had problems with her knees.  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  She wondered if it could be the statins however I am less inclined to think that is the case.  She denies any chest pain.  Although she had a high calcium score, she is asymptomatic and I think there is little role for stress testing in the situation.  We discussed the data from the ISCHEMIA trial which is guiding this.  02/20/2021  Mrs. Erin Cruz returns today for follow-up.  Recently she is seen Coletta Memos, NP several times for shortness of breath.  She had an echo which showed mild to moderate aortic insufficiency, moderate TR, and dilated aortic root at 38 mm and normal systolic function.  Blood pressure was a little elevated.  She has been on 12.5 mg hydrochlorothiazide.  She seems to be tolerating this well with good blood pressure control.  She notes her shortness of breath has resolved.  Cholesterol was well controlled back in March however she is due for repeat lipids.  PMHx:  Past Medical History:  Diagnosis Date   Allergy    seasonal    Arthritis    hand    Cancer ([place]) 1998   neck basal cell   Cataract    left side removed    GERD (gastroesophageal reflux disease)    Hyperlipidemia    Osteopenia    Raynaud phenomenon     Past Surgical History:  Procedure Laterality Date   CATARACT EXTRACTION W/ INTRAOCULAR LENS IMPLANT  Left 2004   left    COLONOSCOPY     [place], 2013 DB- normal    CYSTECTOMY  2008   benign from neck    [place]   ganglion cyst x 2   POPLITEAL SYNOVIAL CYST EXCISION  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 Patrice Paradise   TUBAL LIGATION  [id number]   UMBILICAL HERNIA REPAIR  1979    FAMHx:  Family History  Problem Relation Age of Onset   Colon cancer Father    Hyperlipidemia Father    Leukemia Father        due to treatment for colon cancer   Colon polyps Father    Colon cancer Maternal Grandmother    Muscular dystrophy Mother        [employer]   Hypertension Sister    Breast cancer Paternal Aunt    Breast cancer Paternal Uncle    Esophageal cancer Neg Hx    Rectal cancer Neg Hx    Stomach cancer Neg Hx     SOCHx:   reports that she has quit smoking. She has never used smokeless tobacco. She reports current alcohol use of about 2.0 standard drinks per week. She reports that she does not use drugs.  ALLERGIES:  Allergies  Allergen Reactions   Norco [Hydrocodone-Acetaminophen] Nausea And Vomiting   Morphine And Related Other (See Comments)    Patient stated that she can take this but she has to take it with a nausea medication    ROS: Pertinent items noted in HPI and remainder of comprehensive ROS otherwise negative.  HOME MEDS: Current Outpatient Medications on File Prior to Visit  Medication Sig Dispense Refill   aspirin EC 81 MG tablet Take 81 mg by mouth daily.     BIOTIN 5000 PO Take by mouth.     ezetimibe (ZETIA) 10 MG tablet Take 1 tablet (10 mg total) by mouth daily. 90 tablet 3   glucosamine-chondroitin 500-400 MG tablet Take 1 tablet by mouth 3 (three) times daily.     omeprazole (PRILOSEC) 20 MG capsule 1 capsule 30 minutes before morning meal     rosuvastatin (CRESTOR) 40 MG tablet Take 1 tablet (40 mg total) by mouth daily. 90 tablet 3   hydrochlorothiazide  (MICROZIDE) 12.5 MG capsule Take 1 capsule (12.5 mg total) by mouth daily. 30 capsule 8   naproxen (NAPROSYN) 500 MG tablet Take 1 tablet (500 mg total) by mouth 2 (two) times daily as needed for mild pain, moderate pain or headache (TAKE WITH MEALS.). (Patient not taking: Reported on 02/20/2021) 20 tablet 0   No current facility-administered medications on file prior to visit.    LABS/IMAGING: No results found for this or any previous visit (  from the past 48 hour(s)). No results found.  LIPID PANEL:    Component Value Date/Time   CHOL 132 08/28/2020 0909   TRIG 71 08/28/2020 0909   HDL 45 08/28/2020 0909   CHOLHDL 2.9 08/28/2020 0909   CHOLHDL 6.3 (H) 03/19/2016 0933   VLDL 21 03/19/2016 0933   LDLCALC 73 08/28/2020 0909    WEIGHTS: Wt Readings from Last 3 Encounters:  02/20/21 128 lb (58.1 kg)  12/08/20 129 lb 9.6 oz (58.8 kg)  11/01/20 132 lb 3.2 oz (60 kg)    VITALS: BP 112/68 (BP Location: Left Arm, Patient Position: Sitting, Cuff Size: Normal)   Pulse 64   Ht '5\' 2"'$  (1.575 m)   Wt 128 lb (58.1 kg)   BMI 23.41 kg/m   EXAM: General appearance: alert and no distress Neck: no carotid bruit, no JVD and thyroid not enlarged, symmetric, no tenderness/mass/nodules Lungs: clear to auscultation bilaterally Heart: regular rate and rhythm, S1, S2 normal, no murmur, click, rub or gallop Abdomen: soft, non-tender; bowel sounds normal; no masses,  no organomegaly Extremities: extremities normal, atraumatic, no cyanosis or edema Pulses: 2+ and symmetric Skin: Skin color, texture, turgor normal. No rashes or lesions Neurologic: Grossly normal Psych: Pleasant  EKG: Deferred  ASSESSMENT: Mixed dyslipidemia, goal LDL <70 CAC score of 707, 94th percentile Family history of coronary disease Myalgia on lovastatin  PLAN: 1.  Ms. Zunker reports improvement in her shortness of breath.  Her echo was abnormal showing moderate TR, mild to moderate AI and dilated aortic root to 38  mm.  She will have a repeat echo next summer.  She is due for repeat lipid assessment which I will order today.  Blood pressure is much better controlled on low-dose thiazide.  She denies any anginal symptoms.  Plan follow-up with me next summer after her repeat echo.  No med changes today.  Pixie Casino, MD, [facility], [place] Director of the Advanced Lipid Disorders &  Cardiovascular Risk Reduction Clinic Diplomate of the American Board of Clinical Lipidology Attending Cardiologist  Direct Dial: [phone]  Fax: [phone]  Website:  [email or web address] 02/20/2021, 9:46 AM

## 2021-02-20 NOTE — Patient Instructions (Signed)
Medication Instructions:  NO CHANGES  *If you need a refill on your cardiac medications before your next appointment, please call your pharmacy*   Lab Work: FASTING lipid panel as soon as you are able  If you have labs (blood work) drawn today and your tests are completely normal, you will receive your results only by: [place] (if you have MyChart) OR A paper copy in the mail If you have any lab test that is abnormal or we need to change your treatment, we will call you to review the results.   Testing/Procedures: Echo as schedule for June 2023   Follow-Up: At [facility], you and your health needs are our priority.  As part of our continuing mission to provide you with exceptional heart care, we have created designated Provider Care Teams.  These Care Teams include your primary Cardiologist (physician) and Advanced Practice Providers (APPs -  Physician Assistants and Nurse Practitioners) who all work together to provide you with the care you need, when you need it.  We recommend signing up for the patient portal called "MyChart".  Sign up information is provided on this After Visit Summary.  MyChart is used to connect with patients for Virtual Visits (Telemedicine).  Patients are able to view lab/test results, encounter notes, upcoming appointments, etc.  Non-urgent messages can be sent to your provider as well.   To learn more about what you can do with MyChart, go to [email or web address].    Your next appointment:   June 2023  The format for your next appointment:   In Person  Provider:   You may see Pixie Casino, MD or one of the following Advanced Practice Providers on your designated Care Team:   Almyra Deforest, PA-C Fabian Sharp, PA-C or  Roby Lofts, [place]   Other Instructions

## 2021-03-06 DIAGNOSIS — E785 Hyperlipidemia, unspecified: Secondary | ICD-10-CM | POA: Diagnosis not present

## 2021-03-06 LAB — LIPID PANEL
Chol/HDL Ratio: 3.5 ratio (ref 0.0–4.4)
Cholesterol, Total: 127 mg/dL (ref 100–199)
HDL: 36 mg/dL — ABNORMAL LOW (ref >39)
LDL Chol Calc (NIH): 72 mg/dL (ref 0–99)
Triglycerides: 99 mg/dL (ref 0–149)
VLDL Cholesterol Cal: 19 mg/dL (ref 5–40)

## 2021-03-22 DIAGNOSIS — H353131 Nonexudative age-related macular degeneration, bilateral, early dry stage: Secondary | ICD-10-CM | POA: Diagnosis not present

## 2021-03-22 DIAGNOSIS — H35372 Puckering of macula, left eye: Secondary | ICD-10-CM | POA: Diagnosis not present

## 2021-03-22 DIAGNOSIS — H35352 Cystoid macular degeneration, left eye: Secondary | ICD-10-CM | POA: Diagnosis not present

## 2021-04-06 ENCOUNTER — Other Ambulatory Visit: Payer: Self-pay | Admitting: Physician Assistant

## 2021-04-09 ENCOUNTER — Ambulatory Visit
Admission: RE | Admit: 2021-04-09 | Discharge: 2021-04-09 | Disposition: A | Discharge status: Home / Self Care | Payer: Medicare HMO | Source: Ambulatory Visit | Attending: Family Medicine | Admitting: Family Medicine

## 2021-04-09 ENCOUNTER — Other Ambulatory Visit: Payer: Self-pay

## 2021-04-09 DIAGNOSIS — M810 Age-related osteoporosis without current pathological fracture: Secondary | ICD-10-CM | POA: Diagnosis not present

## 2021-04-09 DIAGNOSIS — Z780 Asymptomatic menopausal state: Secondary | ICD-10-CM | POA: Diagnosis not present

## 2021-04-09 DIAGNOSIS — M8580 Other specified disorders of bone density and structure, unspecified site: Secondary | ICD-10-CM

## 2021-04-20 DIAGNOSIS — H18413 Arcus senilis, bilateral: Secondary | ICD-10-CM | POA: Diagnosis not present

## 2021-04-20 DIAGNOSIS — H353131 Nonexudative age-related macular degeneration, bilateral, early dry stage: Secondary | ICD-10-CM | POA: Diagnosis not present

## 2021-04-20 DIAGNOSIS — H35352 Cystoid macular degeneration, left eye: Secondary | ICD-10-CM | POA: Diagnosis not present

## 2021-04-20 DIAGNOSIS — Z961 Presence of intraocular lens: Secondary | ICD-10-CM | POA: Diagnosis not present

## 2021-05-02 ENCOUNTER — Ambulatory Visit (INDEPENDENT_AMBULATORY_CARE_PROVIDER_SITE_OTHER): Payer: Medicare HMO | Admitting: Ophthalmology

## 2021-05-02 ENCOUNTER — Encounter (INDEPENDENT_AMBULATORY_CARE_PROVIDER_SITE_OTHER): Payer: Self-pay | Admitting: Ophthalmology

## 2021-05-02 ENCOUNTER — Other Ambulatory Visit: Payer: Self-pay

## 2021-05-02 DIAGNOSIS — H35352 Cystoid macular degeneration, left eye: Secondary | ICD-10-CM

## 2021-05-02 DIAGNOSIS — Z961 Presence of intraocular lens: Secondary | ICD-10-CM

## 2021-05-02 MED ORDER — KETOROLAC TROMETHAMINE 0.5 % OP SOLN: 1.0000 [drp] | Freq: Four times a day (QID) | OPHTHALMIC | 0 refills | Status: DC

## 2021-05-02 NOTE — Progress Notes (Signed)
05/02/2021     CHIEF COMPLAINT Patient presents for  Chief Complaint  Patient presents with   Retina Evaluation      HISTORY OF PRESENT ILLNESS: Erin Cruz is a 75 y.o. female who presents to the clinic today for:   HPI     Retina Evaluation   This started 1.5 weeks ago.  Associated Symptoms Floaters (Longstanding.) and Glare (Halos.).  Negative for Flashes, Distortion, Blind Spot, Pain and Photophobia.  Context:  distance vision, mid-range vision and near vision.  Treatments tried include eye drops.  Response to treatment was no improvement.        Comments   NP- ERM OS- referred by Dr. Talbert Forest. "I have a membrane with fluid under it after the cataract surgery. I had 20/25 vision after surgery and now it is like 20/70. Despite drops he has tried, it isn't improving. A refraction is not improving my vision either. It is like looking through water with my left eye. It seems like haloes."      Last edited by Laurin Coder on 05/02/2021  9:12 AM.      Referring physician: Darleen [place], MD [address] [place],  [place] [address]  HISTORICAL INFORMATION:   Selected notes from the MEDICAL RECORD NUMBER       CURRENT MEDICATIONS: Current Outpatient Medications (Ophthalmic Drugs)  Medication Sig   ketorolac (ACULAR) 0.5 % ophthalmic solution Place 1 drop into the left eye 4 (four) times daily for 21 doses.   No current facility-administered medications for this visit. (Ophthalmic Drugs)   Current Outpatient Medications (Other)  Medication Sig   aspirin EC 81 MG tablet Take 81 mg by mouth daily.   BIOTIN 5000 PO Take by mouth.   ezetimibe (ZETIA) 10 MG tablet Take 1 tablet (10 mg total) by mouth daily.   glucosamine-chondroitin 500-400 MG tablet Take 1 tablet by mouth 3 (three) times daily.   hydrochlorothiazide (MICROZIDE) 12.5 MG capsule Take 1 capsule (12.5 mg total) by mouth daily.   naproxen (NAPROSYN) 500 MG tablet Take 1 tablet (500 mg total)  by mouth 2 (two) times daily as needed for mild pain, moderate pain or headache (TAKE WITH MEALS.). (Patient not taking: Reported on 02/20/2021)   omeprazole (PRILOSEC) 20 MG capsule 1 capsule 30 minutes before morning meal   rosuvastatin (CRESTOR) 40 MG tablet Take 1 tablet (40 mg total) by mouth daily.   No current facility-administered medications for this visit. (Other)      REVIEW OF SYSTEMS:    ALLERGIES Allergies  Allergen Reactions   Norco [Hydrocodone-Acetaminophen] Nausea And Vomiting   Morphine And Related Other (See Comments)    Patient stated that she can take this but she has to take it with a nausea medication    PAST MEDICAL HISTORY Past Medical History:  Diagnosis Date   Allergy    seasonal    Arthritis    hand    Cancer ([place]) 1998   neck basal cell   Cataract    left side removed    GERD (gastroesophageal reflux disease)    Hyperlipidemia    Osteopenia    Raynaud phenomenon    Past Surgical History:  Procedure Laterality Date   CATARACT EXTRACTION W/ INTRAOCULAR LENS IMPLANT Left 2004   left    COLONOSCOPY     [place], 2013 [place]- normal    CYSTECTOMY  2008   benign from neck    EYE SURGERY  HAND [place]   ganglion cyst x 2   POPLITEAL SYNOVIAL CYST EXCISION  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 Patrice Paradise   TUBAL LIGATION  [id number]   UMBILICAL HERNIA REPAIR  1979    FAMILY HISTORY Family History  Problem Relation Age of Onset   Colon cancer Father    Hyperlipidemia Father    Leukemia Father        due to treatment for colon cancer   Colon polyps Father    Colon cancer Maternal Grandmother    Muscular dystrophy Mother        [employer]   Hypertension Sister    Breast cancer Paternal Aunt    Breast cancer Paternal Uncle    Esophageal cancer Neg Hx    Rectal cancer Neg Hx    Stomach cancer Neg Hx     SOCIAL HISTORY Social History   Tobacco Use   Smoking  status: Former   Smokeless tobacco: Never   Tobacco comments:    a couple cigarettes a day  Vaping Use   Vaping Use: Never used  Substance Use Topics   Alcohol use: Yes    Alcohol/week: 2.0 standard drinks    Types: 2 Glasses of wine per week   Drug use: No         OPHTHALMIC EXAM:  Base Eye Exam     Visual Acuity (ETDRS)       Right Left   Dist [place] 20/25 -1 20/70 -1   Dist ph [place]  20/50         Tonometry (Tonopen, 9:17 AM)       Right Left   Pressure 6 6         Pupils       Pupils Dark Light Shape React APD   Right PERRL 4 3 Round Brisk None   Left PERRL 4 3 Round Brisk None         Visual Fields (Counting fingers)       Left Right    Full Full         Extraocular Movement       Right Left    Full Full         Neuro/Psych     Oriented x3: Yes   Mood/Affect: Normal         Dilation     Both eyes: 1.0% Mydriacyl, 2.5% Phenylephrine @ 9:17 AM           Slit Lamp and Fundus Exam     External Exam       Right Left   External Normal Normal         Slit Lamp Exam       Right Left   Lids/Lashes Normal Normal   Conjunctiva/Sclera White and quiet White and quiet   Cornea Clear Clear   Anterior Chamber Deep and quiet Deep and quiet   Iris Round and reactive Round and reactive   Lens Posterior chamber intraocular lens Posterior chamber intraocular lens   Anterior Vitreous Normal Normal         Fundus Exam       Right Left   Posterior Vitreous Posterior vitreous detachment Posterior vitreous detachment   Disc Normal Normal   C/D Ratio 0.45 0.4   Macula Normal Epiretinal membrane, Cystoid macular edema   Vessels Normal Normal   Periphery Normal Normal  IMAGING AND PROCEDURES  Imaging and Procedures for 05/02/21  OCT, Retina - OU - Both Eyes       Right Eye Quality was good. Scan locations included subfoveal. Central Foveal Thickness: 288. Progression has no prior data. Findings include normal  foveal contour.   Left Eye Central Foveal Thickness: 479. Progression has no prior data. Findings include abnormal foveal contour, cystoid macular edema.              ASSESSMENT/PLAN:  Cystoid macular edema of left eye  The nature of posterior vitreous detachment was discussed with the patient as well as its physiology, its age prevalence, and its possible implication regarding retinal breaks and detachment.  An informational brochure was offered to the patient.  All the patient's questions were answered.  The patient was asked to return if new or different flashes or floaters develops.   Patient was instructed to contact office immediately if any new changes were noticed. I explained to the patient that vitreous inside the eye is similar to jello inside a bowl. As the jello melts it can start to pull away from the bowl, similarly the vitreous throughout our lives can begin to pull away from the retina. That process is called a posterior vitreous detachment. In some cases, the vitreous can tug hard enough on the retina to form a retinal tear. I discussed with the patient the signs and symptoms of a retinal detachment.  Do not rub the eye.  OS, I explained to the patient that I typically use topical NSAID, will use ketorolac 1 drop left eye 4 times daily initially for 2 weeks.  Follow-up OCT will be performed and if CME has subsided, we will continue only on topical NSAIDs.  If there is no change in the CME, we will at that time deliver periocular, retroseptal Kenalog 40 mg       ICD-10-CM   1. Cystoid macular edema of left eye  H35.352 OCT, Retina - OU - Both Eyes    ketorolac (ACULAR) 0.5 % ophthalmic solution    2. Pseudophakia, left eye  Z96.1       1.  OS with relatively late onset CME at this point pseudophakia with no response to topical combo therapy steroids and NSAID.  We will thus reinstitute therapy today with generic topical NSAID alone and if no improvement in 2 to 3 weeks,  we will add periocular retroseptal Kenalog  2.  3.  Ophthalmic Meds Ordered this visit:  Meds ordered this encounter  Medications   ketorolac (ACULAR) 0.5 % ophthalmic solution    Sig: Place 1 drop into the left eye 4 (four) times daily for 21 doses.    Dispense:  5 mL    Refill:  0       Return in about 3 weeks (around 05/23/2021) for dilate, OS, OCT, possible retroseptal Kenalog.  There are no Patient Instructions on file for this visit.   Explained the diagnoses, plan, and follow up with the patient and they expressed understanding.  Patient expressed understanding of the importance of proper follow up care.   Clent Demark Nekeya Briski M.D. Diseases & Surgery of the Retina and Vitreous Retina & Diabetic [place] 05/02/21     Abbreviations: M myopia (nearsighted); A astigmatism; H hyperopia (farsighted); P presbyopia; Mrx spectacle prescription;  CTL contact lenses; OD right eye; OS left eye; OU both eyes  XT exotropia; ET esotropia; PEK punctate epithelial keratitis; PEE punctate epithelial erosions; DES dry eye syndrome; MGD  meibomian gland dysfunction; ATs artificial tears; PFAT's preservative free artificial tears; [place] nuclear sclerotic cataract; PSC posterior subcapsular cataract; ERM epi-retinal membrane; PVD posterior vitreous detachment; RD retinal detachment; DM diabetes mellitus; DR diabetic retinopathy; NPDR non-proliferative diabetic retinopathy; PDR proliferative diabetic retinopathy; CSME clinically significant macular edema; DME diabetic macular edema; dbh dot blot hemorrhages; CWS cotton wool spot; POAG primary open angle glaucoma; C/D cup-to-disc ratio; HVF humphrey visual field; GVF goldmann visual field; OCT optical coherence tomography; IOP intraocular pressure; BRVO Branch retinal vein occlusion; CRVO central retinal vein occlusion; CRAO central retinal artery occlusion; BRAO branch retinal artery occlusion; RT retinal tear; SB scleral buckle; PPV pars plana vitrectomy;  VH Vitreous hemorrhage; PRP panretinal laser photocoagulation; IVK intravitreal kenalog; VMT vitreomacular traction; MH Macular hole;  NVD neovascularization of the disc; NVE neovascularization elsewhere; AREDS age related eye disease study; ARMD age related macular degeneration; POAG primary open angle glaucoma; EBMD epithelial/anterior basement membrane dystrophy; ACIOL anterior chamber intraocular lens; IOL intraocular lens; PCIOL posterior chamber intraocular lens; Phaco/IOL phacoemulsification with intraocular lens placement; [place] photorefractive keratectomy; LASIK laser assisted in situ keratomileusis; HTN hypertension; DM diabetes mellitus; COPD chronic obstructive pulmonary disease

## 2021-05-02 NOTE — Assessment & Plan Note (Signed)
The nature of posterior vitreous detachment was discussed with the patient as well as its physiology, its age prevalence, and its possible implication regarding retinal breaks and detachment.  An informational brochure was offered to the patient.  All the patient's questions were answered.  The patient was asked to return if new or different flashes or floaters develops.   Patient was instructed to contact office immediately if any new changes were noticed. I explained to the patient that vitreous inside the eye is similar to jello inside a bowl. As the jello melts it can start to pull away from the bowl, similarly the vitreous throughout our lives can begin to pull away from the retina. That process is called a posterior vitreous detachment. In some cases, the vitreous can tug hard enough on the retina to form a retinal tear. I discussed with the patient the signs and symptoms of a retinal detachment.  Do not rub the eye.  OS, I explained to the patient that I typically use topical NSAID, will use ketorolac 1 drop left eye 4 times daily initially for 2 weeks.  Follow-up OCT will be performed and if CME has subsided, we will continue only on topical NSAIDs.  If there is no change in the CME, we will at that time deliver periocular, retroseptal Kenalog 40 mg

## 2021-05-07 ENCOUNTER — Other Ambulatory Visit (INDEPENDENT_AMBULATORY_CARE_PROVIDER_SITE_OTHER): Payer: Self-pay | Admitting: Ophthalmology

## 2021-05-07 DIAGNOSIS — H35352 Cystoid macular degeneration, left eye: Secondary | ICD-10-CM

## 2021-05-07 MED ORDER — KETOROLAC TROMETHAMINE 0.5 % OP SOLN: 1.0000 [drp] | Freq: Four times a day (QID) | OPHTHALMIC | 5 refills | Status: DC

## 2021-05-19 ENCOUNTER — Other Ambulatory Visit: Payer: Self-pay | Admitting: Internal Medicine

## 2021-05-23 ENCOUNTER — Other Ambulatory Visit: Payer: Self-pay

## 2021-05-23 ENCOUNTER — Ambulatory Visit (INDEPENDENT_AMBULATORY_CARE_PROVIDER_SITE_OTHER): Payer: Medicare HMO | Admitting: Ophthalmology

## 2021-05-23 ENCOUNTER — Encounter (INDEPENDENT_AMBULATORY_CARE_PROVIDER_SITE_OTHER): Payer: Self-pay | Admitting: Ophthalmology

## 2021-05-23 DIAGNOSIS — H35352 Cystoid macular degeneration, left eye: Secondary | ICD-10-CM

## 2021-05-23 MED ORDER — KETOROLAC TROMETHAMINE 0.5 % OP SOLN: 1.0000 [drp] | Freq: Four times a day (QID) | OPHTHALMIC | 5 refills | Status: DC

## 2021-05-23 NOTE — Progress Notes (Addendum)
05/23/2021     CHIEF COMPLAINT Patient presents for  Chief Complaint  Patient presents with   Retina Follow Up      HISTORY OF PRESENT ILLNESS: Erin Cruz is a 75 y.o. female who presents to the clinic today for:   HPI     Retina Follow Up   Patient presents with  Other.  In left eye.  This started 3 weeks ago.  Severity is mild.  Duration of 3 weeks.  Since onset it is stable.        Comments   3 week fu OS and OCT, Possible retroseptal Kenalog injection Pt states, "After I started the drops I feel like my vision got a little better but now it seems to be almost worse. The blurriness subsided a little bit at first, now it seems that it is more distorted and I am missing some spots in the center of my vision." Pt reports using Ketorolac QID OS       Last edited by Kendra Opitz, COA on 05/23/2021  9:52 AM.      Referring physician: [name] [place],  [place] [address]  HISTORICAL INFORMATION:   Selected notes from the MEDICAL RECORD NUMBER       CURRENT MEDICATIONS: Current Outpatient Medications (Ophthalmic Drugs)  Medication Sig   ketorolac (ACULAR) 0.5 % ophthalmic solution Place 1 drop into the left eye 4 (four) times daily for 21 doses.   No current facility-administered medications for this visit. (Ophthalmic Drugs)   Current Outpatient Medications (Other)  Medication Sig   aspirin EC 81 MG tablet Take 81 mg by mouth daily.   BIOTIN 5000 PO Take by mouth.   ezetimibe (ZETIA) 10 MG tablet TAKE 1 TABLET BY MOUTH DAILY   glucosamine-chondroitin 500-400 MG tablet Take 1 tablet by mouth 3 (three) times daily.   hydrochlorothiazide (MICROZIDE) 12.5 MG capsule Take 1 capsule (12.5 mg total) by mouth daily.   naproxen (NAPROSYN) 500 MG tablet Take 1 tablet (500 mg total) by mouth 2 (two) times daily as needed for mild pain, moderate pain or headache (TAKE WITH MEALS.). (Patient not taking:  Reported on 02/20/2021)   omeprazole (PRILOSEC) 20 MG capsule 1 capsule 30 minutes before morning meal   rosuvastatin (CRESTOR) 40 MG tablet Take 1 tablet (40 mg total) by mouth daily.   No current facility-administered medications for this visit. (Other)      REVIEW OF SYSTEMS:    ALLERGIES Allergies  Allergen Reactions   Norco [Hydrocodone-Acetaminophen] Nausea And Vomiting   Morphine And Related Other (See Comments)    Patient stated that she can take this but she has to take it with a nausea medication    PAST MEDICAL HISTORY Past Medical History:  Diagnosis Date   Allergy    seasonal    Arthritis    hand    Cancer ([place]) 1998   neck basal cell   Cataract    left side removed    GERD (gastroesophageal reflux disease)    Hyperlipidemia    Osteopenia    Raynaud phenomenon    Past Surgical History:  Procedure Laterality Date   CATARACT EXTRACTION W/ INTRAOCULAR LENS IMPLANT Left 2004   left    COLONOSCOPY     2008 [place], 2013 DB- normal    CYSTECTOMY  2008   benign from neck    [place] [facility]  1970   ganglion cyst x 2   POPLITEAL SYNOVIAL CYST EXCISION  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 Patrice Paradise   TUBAL LIGATION  8676   UMBILICAL HERNIA REPAIR  1979    FAMILY HISTORY Family History  Problem Relation Age of Onset   Colon cancer Father    Hyperlipidemia Father    Leukemia Father        due to treatment for colon cancer   Colon polyps Father    Colon cancer Maternal Grandmother    Muscular dystrophy Mother        [employer]   Hypertension Sister    Breast cancer Paternal Aunt    Breast cancer Paternal Uncle    Esophageal cancer Neg Hx    Rectal cancer Neg Hx    Stomach cancer Neg Hx     SOCIAL HISTORY Social History   Tobacco Use   Smoking status: Former   Smokeless tobacco: Never   Tobacco comments:    a couple cigarettes a day  Vaping Use   Vaping Use:  Never used  Substance Use Topics   Alcohol use: Yes    Alcohol/week: 2.0 standard drinks    Types: 2 Glasses of wine per week   Drug use: No         OPHTHALMIC EXAM:  Base Eye Exam     Visual Acuity (ETDRS)       Right Left   Dist [place] 20/25 +2 20/60   Dist ph [place]  NI         Tonometry (Tonopen, 9:57 AM)       Right Left   Pressure 09 10         Pupils       Pupils Dark Light Shape React APD   Right PERRL 4 3 Round Brisk None   Left PERRL 4 3 Round Brisk None         Visual Fields (Counting fingers)       Left Right    Full Full         Extraocular Movement       Right Left    Full, Ortho Full, Ortho         Neuro/Psych     Oriented x3: Yes   Mood/Affect: Normal         Dilation     Left eye: 1.0% Mydriacyl, 2.5% Phenylephrine @ 9:57 AM           Slit Lamp and Fundus Exam     External Exam       Right Left   External Normal Normal         Slit Lamp Exam       Right Left   Lids/Lashes Normal Normal   Conjunctiva/Sclera White and quiet White and quiet   Cornea Clear Clear   Anterior Chamber Deep and quiet Deep and quiet   Iris Round and reactive Round and reactive   Lens Posterior chamber intraocular lens Posterior chamber intraocular lens   Anterior Vitreous Normal Normal         Fundus Exam       Right Left   Posterior Vitreous Posterior vitreous detachment Posterior vitreous detachment   Disc Normal Normal   C/D Ratio 0.45 0.4   Macula Normal Epiretinal membrane, Cystoid macular edema   Vessels Normal Normal   Periphery Normal Normal  IMAGING AND PROCEDURES  Imaging and Procedures for 05/28/21  OCT, Retina - OU - Both Eyes       Right Eye Quality was good. Scan locations included subfoveal. Central Foveal Thickness: 288. Progression has no prior data. Findings include normal foveal contour.   Left Eye Central Foveal Thickness: 478. Progression has no prior data. Findings include  abnormal foveal contour, cystoid macular edema.      Injection into Tenon's Capsule - OS - Left Eye       Time Out 05/23/2021. 10:45 AM. Confirmed correct patient, procedure, site, and patient consented.   Anesthesia No anesthesia was used.   Procedure A 27 gauge needle was used.   Injection: 40 mg triamcinolone acetonide 40 MG/ML   Route: Other, Site: Left Eye   NDC: [phone], Waste: 0 mL   Post-op Post injection exam found visual acuity of at least counting fingers. The patient tolerated the procedure well. There were no complications. The patient received written and verbal post procedure care education.   Notes Injection applied retroseptal short 27-gauge needle inferotemporal quadrant, no hematoma             ASSESSMENT/PLAN:  Cystoid macular edema of left eye OS, no change over time on top topical ketorolac 4 times daily, will today add retroseptal Kenalog 40 mg, 1 cc delivered retroseptal, for posterior CME treatment      ICD-10-CM   1. Cystoid macular edema of left eye  H35.352 OCT, Retina - OU - Both Eyes    ketorolac (ACULAR) 0.5 % ophthalmic solution    Injection into Tenon's Capsule - OS - Left Eye    triamcinolone acetonide (TRIESENCE) 40 MG/ML subtenons injection 40 mg      1.  Patient reports symptomatic initial improvement in vision left eye however this is waned and is now returned to her baseline from 3 weeks ago only on topical NSAID.  For this reason we will add retroseptal Kenalog OS today  2.  3.  Ophthalmic Meds Ordered this visit:  Meds ordered this encounter  Medications   ketorolac (ACULAR) 0.5 % ophthalmic solution    Sig: Place 1 drop into the left eye 4 (four) times daily for 21 doses.    Dispense:  5 mL    Refill:  5   triamcinolone acetonide (TRIESENCE) 40 MG/ML subtenons injection 40 mg        Return in about 6 weeks (around 07/04/2021) for dilate, OS, OCT possible retroseptal Kenalog OS.  There are no Patient  Instructions on file for this visit.   Explained the diagnoses, plan, and follow up with the patient and they expressed understanding.  Patient expressed understanding of the importance of proper follow up care.   Clent Demark Curt Oatis M.D. Diseases & Surgery of the Retina and Vitreous Retina & Diabetic [place] 05/28/21     Abbreviations: M myopia (nearsighted); A astigmatism; H hyperopia (farsighted); P presbyopia; Mrx spectacle prescription;  CTL contact lenses; OD right eye; OS left eye; OU both eyes  XT exotropia; ET esotropia; PEK punctate epithelial keratitis; PEE punctate epithelial erosions; DES dry eye syndrome; MGD meibomian gland dysfunction; ATs artificial tears; PFAT's preservative free artificial tears; [place] nuclear sclerotic cataract; PSC posterior subcapsular cataract; ERM epi-retinal membrane; PVD posterior vitreous detachment; RD retinal detachment; DM diabetes mellitus; DR diabetic retinopathy; NPDR non-proliferative diabetic retinopathy; PDR proliferative diabetic retinopathy; CSME clinically significant macular edema; DME diabetic macular edema; dbh dot blot hemorrhages; CWS cotton wool spot; POAG primary open angle glaucoma;  C/D cup-to-disc ratio; HVF humphrey visual field; GVF goldmann visual field; OCT optical coherence tomography; IOP intraocular pressure; BRVO Branch retinal vein occlusion; CRVO central retinal vein occlusion; CRAO central retinal artery occlusion; BRAO branch retinal artery occlusion; RT retinal tear; SB scleral buckle; PPV pars plana vitrectomy; VH Vitreous hemorrhage; PRP panretinal laser photocoagulation; IVK intravitreal kenalog; VMT vitreomacular traction; MH Macular hole;  NVD neovascularization of the disc; NVE neovascularization elsewhere; AREDS age related eye disease study; ARMD age related macular degeneration; POAG primary open angle glaucoma; EBMD epithelial/anterior basement membrane dystrophy; ACIOL anterior chamber intraocular lens; IOL intraocular  lens; PCIOL posterior chamber intraocular lens; Phaco/IOL phacoemulsification with intraocular lens placement; [place] photorefractive keratectomy; LASIK laser assisted in situ keratomileusis; HTN hypertension; DM diabetes mellitus; COPD chronic obstructive pulmonary disease

## 2021-05-23 NOTE — Assessment & Plan Note (Signed)
OS, no change over time on top topical ketorolac 4 times daily, will today add retroseptal Kenalog 40 mg, 1 cc delivered retroseptal, for posterior CME treatment

## 2021-05-24 ENCOUNTER — Ambulatory Visit: Payer: Medicare HMO | Admitting: Internal Medicine

## 2021-05-28 DIAGNOSIS — H35352 Cystoid macular degeneration, left eye: Secondary | ICD-10-CM | POA: Diagnosis not present

## 2021-05-28 MED ORDER — TRIAMCINOLONE ACETONIDE 40 MG/ML IO SUSP
40.0000 mg | INTRAOCULAR | Status: AC | PRN
  Administered 2021-05-28: 40 mg

## 2021-05-28 NOTE — Addendum Note (Signed)
Addended by: Deloria Lair A on: 05/28/2021 04:14 PM   Modules accepted: Orders

## 2021-06-26 ENCOUNTER — Other Ambulatory Visit: Payer: Self-pay | Admitting: General Practice

## 2021-07-04 ENCOUNTER — Other Ambulatory Visit: Payer: Self-pay

## 2021-07-04 ENCOUNTER — Ambulatory Visit (INDEPENDENT_AMBULATORY_CARE_PROVIDER_SITE_OTHER): Payer: Medicare HMO | Admitting: Ophthalmology

## 2021-07-04 DIAGNOSIS — H35372 Puckering of macula, left eye: Secondary | ICD-10-CM | POA: Diagnosis not present

## 2021-07-04 DIAGNOSIS — H35352 Cystoid macular degeneration, left eye: Secondary | ICD-10-CM

## 2021-07-04 MED ORDER — KETOROLAC TROMETHAMINE 0.5 % OP SOLN: 1.0000 [drp] | Freq: Four times a day (QID) | OPHTHALMIC | 5 refills | Status: DC

## 2021-07-04 NOTE — Assessment & Plan Note (Addendum)
Vastly improved OS now on combination therapy, topical ketorolac followed by retroseptal Kenalog 40 mg delivered some 6 weeks previous.  Significant improvement in CME and in acuity.  Patient still trouble with brightness, light sensitivity

## 2021-07-04 NOTE — Progress Notes (Signed)
07/04/2021     CHIEF COMPLAINT Patient presents for  Chief Complaint  Patient presents with   Retina Evaluation      HISTORY OF PRESENT ILLNESS: Erin Cruz is a 76 y.o. 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 Last edited by Hurman Horn, MD on 07/04/2021 11:21 AM.      Referring physician: [name] [address],  [place] [address]  HISTORICAL INFORMATION:   Selected notes from the MEDICAL RECORD NUMBER       CURRENT MEDICATIONS: Current Outpatient Medications (Ophthalmic Drugs)  Medication Sig   ketorolac (ACULAR) 0.5 % ophthalmic solution Place 1 drop into the left eye 4 (four) times daily for 21 doses.   No current facility-administered medications for this visit. (Ophthalmic Drugs)   Current Outpatient Medications (Other)  Medication Sig   aspirin EC 81 MG tablet Take 81 mg by mouth daily.   BIOTIN 5000 PO Take by mouth.   ezetimibe (ZETIA) 10 MG tablet TAKE 1 TABLET BY MOUTH DAILY   glucosamine-chondroitin 500-400 MG tablet Take 1 tablet by mouth 3 (three) times daily.   hydrochlorothiazide (MICROZIDE) 12.5 MG capsule Take 1 capsule (12.5 mg total) by mouth daily.   naproxen (NAPROSYN) 500 MG tablet Take 1 tablet (500 mg total) by mouth 2 (two) times daily as needed for mild pain, moderate pain or headache (TAKE WITH MEALS.). (Patient not taking: Reported on 02/20/2021)   omeprazole (PRILOSEC) 20 MG capsule 1 capsule 30 minutes before morning meal   rosuvastatin (CRESTOR) 40 MG tablet Take 1 tablet (40 mg total) by mouth daily.   No current facility-administered medications for this visit. (Other)      REVIEW OF SYSTEMS:    ALLERGIES Allergies   Allergen Reactions   Norco [Hydrocodone-Acetaminophen] Nausea And Vomiting   Morphine And Related Other (See Comments)    Patient stated that she can take this but she has to take it with a nausea medication    PAST MEDICAL HISTORY Past Medical History:  Diagnosis Date   Allergy    seasonal    Arthritis    hand    Cancer ([place]) 1998   neck basal cell   Cataract    left side removed    GERD (gastroesophageal reflux disease)    Hyperlipidemia    Osteopenia    Raynaud phenomenon    Past Surgical History:  Procedure Laterality Date   CATARACT EXTRACTION W/ INTRAOCULAR LENS IMPLANT Left 2004   left    COLONOSCOPY     [place], 2013 DB- normal    CYSTECTOMY  2008   benign from neck    [place]   ganglion cyst x 2   POPLITEAL SYNOVIAL CYST EXCISION  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 Patrice Paradise   TUBAL LIGATION  4270   UMBILICAL HERNIA REPAIR  1979    FAMILY HISTORY Family History  Problem Relation Age of Onset   Colon cancer Father    Hyperlipidemia Father    Leukemia Father  due to treatment for colon cancer   Colon polyps Father    Colon cancer Maternal Grandmother    Muscular dystrophy Mother        [employer]   Hypertension Sister    Breast cancer Paternal Aunt    Breast cancer Paternal Uncle    Esophageal cancer Neg Hx    Rectal cancer Neg Hx    Stomach cancer Neg Hx     SOCIAL HISTORY Social History   Tobacco Use   Smoking status: Former   Smokeless tobacco: Never   Tobacco comments:    a couple cigarettes a day  Vaping Use   Vaping Use: Never used  Substance Use Topics   Alcohol use: Yes    Alcohol/week: 2.0 standard drinks    Types: 2 Glasses of wine per week   Drug use: No         OPHTHALMIC EXAM:  Base Eye Exam     Visual Acuity (ETDRS)       Right Left   Dist [place] 20/20 +1 20/25 +2         Tonometry (Tonopen, 11:19 AM)        Right Left   Pressure 12 12         Pupils       Pupils APD   Right PERRL None   Left PERRL None         Visual Fields       Left Right    Full Full         Extraocular Movement       Right Left    Full, Ortho Full, Ortho         Neuro/Psych     Oriented x3: Yes   Mood/Affect: Normal         Dilation     Left eye: 1.0% Mydriacyl, 2.5% Phenylephrine @ 11:22 AM           Slit Lamp and Fundus Exam     External Exam       Right Left   External Normal Normal         Slit Lamp Exam       Right Left   Lids/Lashes Normal Normal   Conjunctiva/Sclera White and quiet White and quiet   Cornea Clear Clear   Anterior Chamber Deep and quiet Deep and quiet   Iris Round and reactive Round and reactive, NO TI Defects   Lens Posterior chamber intraocular lens Posterior chamber intraocular lens   Anterior Vitreous Normal Normal         Fundus Exam       Right Left   Posterior Vitreous  Posterior vitreous detachment   Disc  Normal   C/D Ratio 0.45 0.4   Macula  Epiretinal membrane, Cystoid macular edema   Vessels  Normal   Periphery  Normal            IMAGING AND PROCEDURES  Imaging and Procedures for 07/04/21           ASSESSMENT/PLAN:  Left epiretinal membrane Residual ERM, may be contributing to residual CME OS.  Cystoid macular edema of left eye Vastly improved OS now on combination therapy, topical ketorolac followed by retroseptal Kenalog 40 mg delivered some 6 weeks previous.  Significant improvement in CME and in acuity.  Patient still trouble with brightness, light sensitivity     ICD-10-CM   1. Left epiretinal membrane  H35.372     2. Cystoid macular edema  of left eye  H35.352       1.  OS vastly improved acuity and macular findings as less CME.  Post retroseptal Kenalog combination with topical NSAID therapy.  2.  Minor residual CME likely due to epiretinal membrane visible clinically and now very clearly seen on  OCT.  I explained to the patient that this may very well be residual best that the I can see and that we will monitor for epiretinal membrane progression at which time if this were to happen the only potential remedy is vitrectomy membrane peel of that condition should it develop or worsen  3.  Ophthalmic Meds Ordered this visit:  No orders of the defined types were placed in this encounter.      Return in about 6 weeks (around 08/15/2021) for OS, dilate, OCT.  There are no Patient Instructions on file for this visit.   Explained the diagnoses, plan, and follow up with the patient and they expressed understanding.  Patient expressed understanding of the importance of proper follow up care.   Clent Demark Zamere Pasternak M.D. Diseases & Surgery of the Retina and Vitreous Retina & Diabetic [place] 07/04/21     Abbreviations: M myopia (nearsighted); A astigmatism; H hyperopia (farsighted); P presbyopia; Mrx spectacle prescription;  CTL contact lenses; OD right eye; OS left eye; OU both eyes  XT exotropia; ET esotropia; PEK punctate epithelial keratitis; PEE punctate epithelial erosions; DES dry eye syndrome; MGD meibomian gland dysfunction; ATs artificial tears; PFAT's preservative free artificial tears; [place] nuclear sclerotic cataract; PSC posterior subcapsular cataract; ERM epi-retinal membrane; PVD posterior vitreous detachment; RD retinal detachment; DM diabetes mellitus; DR diabetic retinopathy; NPDR non-proliferative diabetic retinopathy; PDR proliferative diabetic retinopathy; CSME clinically significant macular edema; DME diabetic macular edema; dbh dot blot hemorrhages; CWS cotton wool spot; POAG primary open angle glaucoma; C/D cup-to-disc ratio; HVF humphrey visual field; GVF goldmann visual field; OCT optical coherence tomography; IOP intraocular pressure; BRVO Branch retinal vein occlusion; CRVO central retinal vein occlusion; CRAO central retinal artery occlusion; BRAO branch retinal artery  occlusion; RT retinal tear; SB scleral buckle; PPV pars plana vitrectomy; VH Vitreous hemorrhage; PRP panretinal laser photocoagulation; IVK intravitreal kenalog; VMT vitreomacular traction; MH Macular hole;  NVD neovascularization of the disc; NVE neovascularization elsewhere; AREDS age related eye disease study; ARMD age related macular degeneration; POAG primary open angle glaucoma; EBMD epithelial/anterior basement membrane dystrophy; ACIOL anterior chamber intraocular lens; IOL intraocular lens; PCIOL posterior chamber intraocular lens; Phaco/IOL phacoemulsification with intraocular lens placement; [place] photorefractive keratectomy; LASIK laser assisted in situ keratomileusis; HTN hypertension; DM diabetes mellitus; COPD chronic obstructive pulmonary disease

## 2021-07-04 NOTE — Assessment & Plan Note (Signed)
Residual ERM, may be contributing to residual CME OS.

## 2021-07-14 DIAGNOSIS — R319 Hematuria, unspecified: Secondary | ICD-10-CM | POA: Diagnosis not present

## 2021-07-14 DIAGNOSIS — N390 Urinary tract infection, site not specified: Secondary | ICD-10-CM | POA: Diagnosis not present

## 2021-08-15 ENCOUNTER — Encounter (INDEPENDENT_AMBULATORY_CARE_PROVIDER_SITE_OTHER): Payer: Medicare HMO | Admitting: Ophthalmology

## 2021-08-20 ENCOUNTER — Ambulatory Visit (INDEPENDENT_AMBULATORY_CARE_PROVIDER_SITE_OTHER): Payer: Medicare HMO | Admitting: Ophthalmology

## 2021-08-20 ENCOUNTER — Other Ambulatory Visit: Payer: Self-pay

## 2021-08-20 ENCOUNTER — Encounter (INDEPENDENT_AMBULATORY_CARE_PROVIDER_SITE_OTHER): Payer: Self-pay | Admitting: Ophthalmology

## 2021-08-20 DIAGNOSIS — H35372 Puckering of macula, left eye: Secondary | ICD-10-CM | POA: Diagnosis not present

## 2021-08-20 DIAGNOSIS — H35352 Cystoid macular degeneration, left eye: Secondary | ICD-10-CM

## 2021-08-20 MED ORDER — KETOROLAC TROMETHAMINE 0.5 % OP SOLN: 1.0000 [drp] | Freq: Four times a day (QID) | OPHTHALMIC | 5 refills | Status: DC

## 2021-08-20 NOTE — Assessment & Plan Note (Addendum)
Improved on topical nonsteroidals, 4 times daily of ketorolac, patient to continue on this for 2 more months and then taper to twice daily.  We are then going to see in 3 months whether or not she can remain maintain this taper or potentially discontinue thereafter

## 2021-08-20 NOTE — Progress Notes (Signed)
08/20/2021     CHIEF COMPLAINT Patient presents for  Chief Complaint  Patient presents with   Cystoid Macular Edema      HISTORY OF PRESENT ILLNESS: Erin Cruz is a 76 y.o. female who presents to the clinic today for:   HPI   Patient continues to be compliant with topical nonsteroidal usage left eye, no interval change in vision,  "Might need glasses to obtain the best quality vision" Last edited by Hurman Horn, MD on 08/20/2021 11:02 AM.      Referring physician: Glenis Smoker, MD [place],  [place] [address]  HISTORICAL INFORMATION:   Selected notes from the [facility]: Current Outpatient Medications (Ophthalmic Drugs)  Medication Sig   ketorolac (ACULAR) 0.5 % ophthalmic solution Place 1 drop into the left eye 4 (four) times daily for 21 doses.   No current facility-administered medications for this visit. (Ophthalmic Drugs)   Current Outpatient Medications (Other)  Medication Sig   aspirin EC 81 MG tablet Take 81 mg by mouth daily.   BIOTIN 5000 PO Take by mouth.   ezetimibe (ZETIA) 10 MG tablet TAKE 1 TABLET BY MOUTH DAILY   glucosamine-chondroitin 500-400 MG tablet Take 1 tablet by mouth 3 (three) times daily.   hydrochlorothiazide (MICROZIDE) 12.5 MG capsule Take 1 capsule (12.5 mg total) by mouth daily.   naproxen (NAPROSYN) 500 MG tablet Take 1 tablet (500 mg total) by mouth 2 (two) times daily as needed for mild pain, moderate pain or headache (TAKE WITH MEALS.). (Patient not taking: Reported on 02/20/2021)   omeprazole (PRILOSEC) 20 MG capsule 1 capsule 30 minutes before morning meal   rosuvastatin (CRESTOR) 40 MG tablet Take 1 tablet (40 mg total) by mouth daily.   No current facility-administered medications for this visit. (Other)      REVIEW OF SYSTEMS: ROS   Negative for: Constitutional, Gastrointestinal, Neurological, Skin, Genitourinary, Musculoskeletal, HENT, Endocrine,  Cardiovascular, Eyes, Respiratory, Psychiatric, Allergic/Imm, Heme/Lymph Last edited by Hurman Horn, MD on 08/20/2021 10:56 AM.       ALLERGIES Allergies  Allergen Reactions   Norco [Hydrocodone-Acetaminophen] Nausea And Vomiting   Morphine And Related Other (See Comments)    Patient stated that she can take this but she has to take it with a nausea medication    PAST MEDICAL HISTORY Past Medical History:  Diagnosis Date   Allergy    seasonal    Arthritis    hand    Cancer ([place]) 1998   neck basal cell   Cataract    left side removed    GERD (gastroesophageal reflux disease)    Hyperlipidemia    Osteopenia    Raynaud phenomenon    Past Surgical History:  Procedure Laterality Date   CATARACT EXTRACTION W/ INTRAOCULAR LENS IMPLANT Left 2004   left    COLONOSCOPY     2008 [place], 2013 DB- normal    CYSTECTOMY  2008   benign from neck    [place]   ganglion cyst x 2   POPLITEAL SYNOVIAL CYST EXCISION  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 Patrice Paradise   TUBAL LIGATION  4854   UMBILICAL HERNIA REPAIR  1979    FAMILY HISTORY Family History  Problem Relation Age of Onset   Colon  cancer Father    Hyperlipidemia Father    Leukemia Father        due to treatment for colon cancer   Colon polyps Father    Colon cancer Maternal Grandmother    Muscular dystrophy Mother        [employer]   Hypertension Sister    Breast cancer Paternal Aunt    Breast cancer Paternal Uncle    Esophageal cancer Neg Hx    Rectal cancer Neg Hx    Stomach cancer Neg Hx     SOCIAL HISTORY Social History   Tobacco Use   Smoking status: Former   Smokeless tobacco: Never   Tobacco comments:    a couple cigarettes a day  Vaping Use   Vaping Use: Never used  Substance Use Topics   Alcohol use: Yes    Alcohol/week: 2.0 standard drinks    Types: 2 Glasses of wine per week   Drug use: No          OPHTHALMIC EXAM:  Base Eye Exam     Visual Acuity (ETDRS)       Right Left   Dist [place] 20/25 20/30 +1   Dist ph [place] 20/20 NI         Tonometry (Tonopen, 11:00 AM)       Right Left   Pressure 11 10         Pupils       Pupils APD   Right PERRL None   Left PERRL None         Visual Fields       Left Right    Full Full         Extraocular Movement       Right Left    Full, Ortho Full, Ortho         Neuro/Psych     Oriented x3: Yes   Mood/Affect: Normal         Dilation     Both eyes: 1.0% Mydriacyl, 2.5% Phenylephrine @ 11:00 AM           Slit Lamp and Fundus Exam     External Exam       Right Left   External Normal Normal         Slit Lamp Exam       Right Left   Lids/Lashes Normal Normal   Conjunctiva/Sclera White and quiet White and quiet   Cornea Clear Clear   Anterior Chamber Deep and quiet Deep and quiet   Iris Round and reactive Round and reactive, NO TI Defects   Lens Posterior chamber intraocular lens Posterior chamber intraocular lens   Anterior Vitreous Normal Normal         Fundus Exam       Right Left   Posterior Vitreous  Posterior vitreous detachment   Disc  Normal   C/D Ratio 0.45 0.4   Macula  Epiretinal membrane, Cystoid macular edema   Vessels  Normal   Periphery  Normal            IMAGING AND PROCEDURES  Imaging and Procedures for 08/20/21  OCT, Retina - OU - Both Eyes       Right Eye Quality was good. Scan locations included subfoveal. Central Foveal Thickness: 289. Progression has no prior data. Findings include normal foveal contour.   Left Eye Central Foveal Thickness: 349. Progression has no prior data. Findings include abnormal foveal contour, cystoid macular edema, epiretinal membrane.   Notes  Minor epiretinal membrane remains, small foveal depression has returned, and only 1 slice of the finding in the left eye has a CME perifoveal.  Vastly improved overall on topical NSAIDs.   Continue             ASSESSMENT/PLAN:  Cystoid macular edema of left eye Improved on topical nonsteroidals, 4 times daily of ketorolac, patient to continue on this for 2 more months and then taper to twice daily.  We are then going to see in 3 months whether or not she can remain maintain this taper or potentially discontinue thereafter     ICD-10-CM   1. Left epiretinal membrane  H35.372     2. Cystoid macular edema of left eye  H35.352 OCT, Retina - OU - Both Eyes      1.Minor epiretinal membrane remains, small foveal depression has returned, and only 1 slice of the finding in the left eye has a CME perifoveal.  Vastly improved overall on topical NSAIDs.  Continue  2.  Topical medication reordered  3.  Ophthalmic Meds Ordered this visit:  No orders of the defined types were placed in this encounter.      Return in about 3 months (around 11/17/2021) for dilate, OS, OCT.  There are no Patient Instructions on file for this visit.   Explained the diagnoses, plan, and follow up with the patient and they expressed understanding.  Patient expressed understanding of the importance of proper follow up care.   Clent Demark Morningstar Toft M.D. Diseases & Surgery of the Retina and Vitreous Retina & Diabetic [place] 08/20/21     Abbreviations: M myopia (nearsighted); A astigmatism; H hyperopia (farsighted); P presbyopia; Mrx spectacle prescription;  CTL contact lenses; OD right eye; OS left eye; OU both eyes  XT exotropia; ET esotropia; PEK punctate epithelial keratitis; PEE punctate epithelial erosions; DES dry eye syndrome; MGD meibomian gland dysfunction; ATs artificial tears; PFAT's preservative free artificial tears; [place] nuclear sclerotic cataract; PSC posterior subcapsular cataract; ERM epi-retinal membrane; PVD posterior vitreous detachment; RD retinal detachment; DM diabetes mellitus; DR diabetic retinopathy; NPDR non-proliferative diabetic retinopathy; PDR proliferative diabetic  retinopathy; CSME clinically significant macular edema; DME diabetic macular edema; dbh dot blot hemorrhages; CWS cotton wool spot; POAG primary open angle glaucoma; C/D cup-to-disc ratio; HVF humphrey visual field; GVF goldmann visual field; OCT optical coherence tomography; IOP intraocular pressure; BRVO Branch retinal vein occlusion; CRVO central retinal vein occlusion; CRAO central retinal artery occlusion; BRAO branch retinal artery occlusion; RT retinal tear; SB scleral buckle; PPV pars plana vitrectomy; VH Vitreous hemorrhage; PRP panretinal laser photocoagulation; IVK intravitreal kenalog; VMT vitreomacular traction; MH Macular hole;  NVD neovascularization of the disc; NVE neovascularization elsewhere; AREDS age related eye disease study; ARMD age related macular degeneration; POAG primary open angle glaucoma; EBMD epithelial/anterior basement membrane dystrophy; ACIOL anterior chamber intraocular lens; IOL intraocular lens; PCIOL posterior chamber intraocular lens; Phaco/IOL phacoemulsification with intraocular lens placement; [place] photorefractive keratectomy; LASIK laser assisted in situ keratomileusis; HTN hypertension; DM diabetes mellitus; COPD chronic obstructive pulmonary disease

## 2021-09-04 ENCOUNTER — Other Ambulatory Visit: Payer: Self-pay | Admitting: General Practice

## 2021-10-02 ENCOUNTER — Encounter (INDEPENDENT_AMBULATORY_CARE_PROVIDER_SITE_OTHER): Payer: Self-pay | Admitting: Ophthalmology

## 2021-10-02 ENCOUNTER — Ambulatory Visit (INDEPENDENT_AMBULATORY_CARE_PROVIDER_SITE_OTHER): Payer: Medicare HMO | Admitting: Ophthalmology

## 2021-10-02 DIAGNOSIS — H35352 Cystoid macular degeneration, left eye: Secondary | ICD-10-CM

## 2021-10-02 DIAGNOSIS — H35372 Puckering of macula, left eye: Secondary | ICD-10-CM | POA: Diagnosis not present

## 2021-10-02 NOTE — Assessment & Plan Note (Signed)
Persistent and now worsening of CME slightly since February 2023 while on ketorolac 4 times daily.  This is coincident with progression of epiretinal membrane over the macular region. ? ?CME with her blurred vision and persistence of symptoms could very well be from the epiretinal membrane ?

## 2021-10-02 NOTE — Progress Notes (Signed)
10/02/2021     CHIEF COMPLAINT Patient presents for  Chief Complaint  Patient presents with   Retina Follow Up      HISTORY OF PRESENT ILLNESS: Erin Cruz is a 76 y.o. female who presents to the clinic today for:   HPI     Retina Follow Up           Diagnosis: ERM   Laterality: left eye   Onset: 6 weeks ago         Comments   6 weeks dilate OS, OCT. Pt requested appt to be moved up from 3 months to now. Patient last seen here 08/20/2021. Pt states her vision has worsened over the last couple weeks. Patient reports extreme light sensitivity as well. Pt uses Ketorolac OS QID. Pt request only left eye be dilated today.      Last edited by Hurman Horn, MD on 10/02/2021  9:42 AM.      Referring physician: Glenis Smoker, MD [place],  [place] [address]  HISTORICAL INFORMATION:   Selected notes from the MEDICAL RECORD NUMBER       CURRENT MEDICATIONS: Current Outpatient Medications (Ophthalmic Drugs)  Medication Sig   ketorolac (ACULAR) 0.5 % ophthalmic solution Place 1 drop into the left eye 4 (four) times daily for 600 doses.   No current facility-administered medications for this visit. (Ophthalmic Drugs)   Current Outpatient Medications (Other)  Medication Sig   aspirin EC 81 MG tablet Take 81 mg by mouth daily.   BIOTIN 5000 PO Take by mouth.   ezetimibe (ZETIA) 10 MG tablet TAKE 1 TABLET BY MOUTH DAILY   glucosamine-chondroitin 500-400 MG tablet Take 1 tablet by mouth 3 (three) times daily.   hydrochlorothiazide (MICROZIDE) 12.5 MG capsule TAKE ONE CAPSULE BY MOUTH DAILY   naproxen (NAPROSYN) 500 MG tablet Take 1 tablet (500 mg total) by mouth 2 (two) times daily as needed for mild pain, moderate pain or headache (TAKE WITH MEALS.). (Patient not taking: Reported on 02/20/2021)   omeprazole (PRILOSEC) 20 MG capsule 1 capsule 30 minutes before morning meal   rosuvastatin (CRESTOR) 40 MG tablet Take 1 tablet (40 mg total) by  mouth daily.   No current facility-administered medications for this visit. (Other)      REVIEW OF SYSTEMS: ROS   Negative for: Constitutional, Gastrointestinal, Neurological, Skin, Genitourinary, Musculoskeletal, HENT, Endocrine, Cardiovascular, Eyes, Respiratory, Psychiatric, Allergic/Imm, Heme/Lymph Last edited by Hurman Horn, MD on 10/02/2021 10:09 AM.       ALLERGIES Allergies  Allergen Reactions   Norco [Hydrocodone-Acetaminophen] Nausea And Vomiting   Morphine And Related Other (See Comments)    Patient stated that she can take this but she has to take it with a nausea medication    PAST MEDICAL HISTORY Past Medical History:  Diagnosis Date   Allergy    seasonal    Arthritis    hand    Cancer ([place]) 1998   neck basal cell   Cataract    left side removed    GERD (gastroesophageal reflux disease)    Hyperlipidemia    Osteopenia    Raynaud phenomenon    Past Surgical History:  Procedure Laterality Date   CATARACT EXTRACTION W/ INTRAOCULAR LENS IMPLANT Left 2004   left    COLONOSCOPY     2008 [place], 2013 DB- normal    CYSTECTOMY  2008   benign from neck    EYE SURGERY     HAND SURGERY  [place]   ganglion cyst x 2   POPLITEAL SYNOVIAL CYST EXCISION  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 Patrice Paradise   TUBAL LIGATION  8527   UMBILICAL HERNIA REPAIR  1979    FAMILY HISTORY Family History  Problem Relation Age of Onset   Colon cancer Father    Hyperlipidemia Father    Leukemia Father        due to treatment for colon cancer   Colon polyps Father    Colon cancer Maternal Grandmother    Muscular dystrophy Mother        [employer]   Hypertension Sister    Breast cancer Paternal Aunt    Breast cancer Paternal Uncle    Esophageal cancer Neg Hx    Rectal cancer Neg Hx    Stomach cancer Neg Hx     SOCIAL HISTORY Social History   Tobacco Use   Smoking status: Former   Smokeless tobacco:  Never   Tobacco comments:    a couple cigarettes a day  Vaping Use   Vaping Use: Never used  Substance Use Topics   Alcohol use: Yes    Alcohol/week: 2.0 standard drinks    Types: 2 Glasses of wine per week   Drug use: No         OPHTHALMIC EXAM:  Base Eye Exam     Visual Acuity (ETDRS)       Right Left   Dist [place] 20/30 -1 20/50 -1   Dist ph [place]  20/30         Tonometry (Tonopen, 9:36 AM)       Right Left   Pressure 6 6         Pupils       Pupils APD   Right PERRL None   Left PERRL None         Visual Fields (Counting fingers)       Left Right    Full Full         Extraocular Movement       Right Left    Full Full         Neuro/Psych     Oriented x3: Yes   Mood/Affect: Normal         Dilation     Left eye: 1.0% Mydriacyl, 2.5% Phenylephrine @ 9:36 AM           Slit Lamp and Fundus Exam     External Exam       Right Left   External Normal Normal         Slit Lamp Exam       Right Left   Lids/Lashes Normal Normal   Conjunctiva/Sclera White and quiet White and quiet   Cornea Clear Clear   Anterior Chamber Deep and quiet Deep and quiet   Iris Round and reactive Round and reactive, NO TI Defects   Lens Posterior chamber intraocular lens Posterior chamber intraocular lens   Anterior Vitreous Normal Normal         Fundus Exam       Right Left   Posterior Vitreous  Posterior vitreous detachment   Disc  Normal   C/D Ratio  0.4   Macula  Epiretinal membrane, Cystoid macular edema   Vessels  Normal   Periphery  Normal            IMAGING AND PROCEDURES  Imaging and Procedures for 10/02/21  OCT, Retina - OU - Both Eyes       Right Eye Quality was good. Scan locations included subfoveal. Central Foveal Thickness: 291. Progression has been stable. Findings include normal foveal contour.   Left Eye Central Foveal Thickness: 352. Progression has worsened. Findings include abnormal foveal contour, cystoid  macular edema, epiretinal membrane.   Notes Minor epiretinal membrane remains, foveal depression now diminished and thickening secondary to ERM.  And only 1 slice of the finding in the left eye has a CME perifoveal yet slightly enlarged as compared to February 2023.  Vastly improved overall on topical NSAIDs its onset of therapy November but worse over the last 6 weeks.       Color Fundus Photography Optos - OU - Both Eyes       Right Eye Disc findings include normal observations. Macula : normal observations. Vessels : normal observations. Periphery : normal observations.   Left Eye Progression has been stable. Disc findings include normal observations. Macula : epiretinal membrane. Periphery : normal observations.              ASSESSMENT/PLAN:  Cystoid macular edema of left eye Persistent and now worsening of CME slightly since February 2023 while on ketorolac 4 times daily.  This is coincident with progression of epiretinal membrane over the macular region.  CME with her blurred vision and persistence of symptoms could very well be from the epiretinal membrane  Left epiretinal membrane Loss of foveal depression due to epiretinal membrane now with visual acuity impact and glare symptoms.       ICD-10-CM   1. Left epiretinal membrane  H35.372 OCT, Retina - OU - Both Eyes    Color Fundus Photography Optos - OU - Both Eyes    2. Cystoid macular edema of left eye  H35.352       1.  OS with progression of epiretinal membrane with minor to little change in the CME yet symptomatic with the patient impacting acuity.  2.  I have explained the surgical intervention for the epiretinal membranes is 95 to 98% successful clinically relieving the tractional forces but that if someone has undiagnosed or untreated sleep apnea it is my experience of the persistent CME can develop or even worsen.  I have shared this experience with the patient she would like to proceed with a home self  monitoring using the ring oximeter from one of the companies called [employer] which can continuously monitor oxygenation at night and as a screening not confirmatory for potential for sleep apnea which can mean low oxygen at night which can contribute to ongoing swelling in the center of the  3.  In the meantime continue on ketorolac 3 times daily OS  Follow-up in 6 weeks as we assess these issues.  Patient may contact us for earlier appointment at any time   Ophthalmic Meds Ordered this visit:  No orders of the defined types were placed in this encounter.      Return in about 6 weeks (around 11/13/2021) for dilate, OS, OCT.  There are no Patient Instructions on file for this visit.   Explained the diagnoses, plan, and follow up with the patient and they expressed understanding.  Patient expressed understanding of the importance of proper follow up care.   Clent Demark Haylyn Halberg M.D. Diseases & Surgery of the Retina and Vitreous Retina & Diabetic [place] 10/02/21     Abbreviations: M myopia (nearsighted); A astigmatism; H hyperopia (farsighted);  P presbyopia; Mrx spectacle prescription;  CTL contact lenses; OD right eye; OS left eye; OU both eyes  XT exotropia; ET esotropia; PEK punctate epithelial keratitis; PEE punctate epithelial erosions; DES dry eye syndrome; MGD meibomian gland dysfunction; ATs artificial tears; PFAT's preservative free artificial tears; [place] nuclear sclerotic cataract; PSC posterior subcapsular cataract; ERM epi-retinal membrane; PVD posterior vitreous detachment; RD retinal detachment; DM diabetes mellitus; DR diabetic retinopathy; NPDR non-proliferative diabetic retinopathy; PDR proliferative diabetic retinopathy; CSME clinically significant macular edema; DME diabetic macular edema; dbh dot blot hemorrhages; CWS cotton wool spot; POAG primary open angle glaucoma; C/D cup-to-disc ratio; HVF humphrey visual field; GVF goldmann visual field; OCT optical coherence  tomography; IOP intraocular pressure; BRVO Branch retinal vein occlusion; CRVO central retinal vein occlusion; CRAO central retinal artery occlusion; BRAO branch retinal artery occlusion; RT retinal tear; SB scleral buckle; PPV pars plana vitrectomy; VH Vitreous hemorrhage; PRP panretinal laser photocoagulation; IVK intravitreal kenalog; VMT vitreomacular traction; MH Macular hole;  NVD neovascularization of the disc; NVE neovascularization elsewhere; AREDS age related eye disease study; ARMD age related macular degeneration; POAG primary open angle glaucoma; EBMD epithelial/anterior basement membrane dystrophy; ACIOL anterior chamber intraocular lens; IOL intraocular lens; PCIOL posterior chamber intraocular lens; Phaco/IOL phacoemulsification with intraocular lens placement; [place] photorefractive keratectomy; LASIK laser assisted in situ keratomileusis; HTN hypertension; DM diabetes mellitus; COPD chronic obstructive pulmonary disease

## 2021-10-02 NOTE — Assessment & Plan Note (Signed)
Loss of foveal depression due to epiretinal membrane now with visual acuity impact and glare symptoms. ? ? ?

## 2021-10-10 ENCOUNTER — Other Ambulatory Visit: Payer: Self-pay | Admitting: Family Medicine

## 2021-10-10 DIAGNOSIS — Z1231 Encounter for screening mammogram for malignant neoplasm of breast: Secondary | ICD-10-CM

## 2021-10-12 ENCOUNTER — Ambulatory Visit
Admission: RE | Admit: 2021-10-12 | Discharge: 2021-10-12 | Disposition: A | Discharge status: Home / Self Care | Payer: Medicare HMO | Source: Ambulatory Visit | Attending: Family Medicine | Admitting: Family Medicine

## 2021-10-12 DIAGNOSIS — Z1231 Encounter for screening mammogram for malignant neoplasm of breast: Secondary | ICD-10-CM

## 2021-11-08 ENCOUNTER — Encounter (INDEPENDENT_AMBULATORY_CARE_PROVIDER_SITE_OTHER): Payer: Medicare HMO | Admitting: Ophthalmology

## 2021-11-09 DIAGNOSIS — Z1211 Encounter for screening for malignant neoplasm of colon: Secondary | ICD-10-CM | POA: Diagnosis not present

## 2021-11-09 DIAGNOSIS — K219 Gastro-esophageal reflux disease without esophagitis: Secondary | ICD-10-CM | POA: Diagnosis not present

## 2021-11-09 DIAGNOSIS — R931 Abnormal findings on diagnostic imaging of heart and coronary circulation: Secondary | ICD-10-CM | POA: Diagnosis not present

## 2021-11-09 DIAGNOSIS — M8589 Other specified disorders of bone density and structure, multiple sites: Secondary | ICD-10-CM | POA: Diagnosis not present

## 2021-11-09 DIAGNOSIS — E7800 Pure hypercholesterolemia, unspecified: Secondary | ICD-10-CM | POA: Diagnosis not present

## 2021-11-09 DIAGNOSIS — Z0000 Encounter for general adult medical examination without abnormal findings: Secondary | ICD-10-CM | POA: Diagnosis not present

## 2021-11-09 DIAGNOSIS — R030 Elevated blood-pressure reading, without diagnosis of hypertension: Secondary | ICD-10-CM | POA: Diagnosis not present

## 2021-11-13 DIAGNOSIS — Z1211 Encounter for screening for malignant neoplasm of colon: Secondary | ICD-10-CM | POA: Diagnosis not present

## 2021-11-14 ENCOUNTER — Encounter (INDEPENDENT_AMBULATORY_CARE_PROVIDER_SITE_OTHER): Payer: Medicare HMO | Admitting: Ophthalmology

## 2021-11-15 ENCOUNTER — Encounter (INDEPENDENT_AMBULATORY_CARE_PROVIDER_SITE_OTHER): Payer: Medicare HMO | Admitting: Ophthalmology

## 2021-11-28 ENCOUNTER — Ambulatory Visit (HOSPITAL_COMMUNITY): Payer: Medicare HMO | Attending: Cardiovascular Disease

## 2021-11-28 DIAGNOSIS — I340 Nonrheumatic mitral (valve) insufficiency: Secondary | ICD-10-CM

## 2021-11-28 DIAGNOSIS — I77810 Thoracic aortic ectasia: Secondary | ICD-10-CM | POA: Insufficient documentation

## 2021-11-28 DIAGNOSIS — E785 Hyperlipidemia, unspecified: Secondary | ICD-10-CM | POA: Diagnosis not present

## 2021-11-28 DIAGNOSIS — I351 Nonrheumatic aortic (valve) insufficiency: Secondary | ICD-10-CM

## 2021-11-28 DIAGNOSIS — Z8249 Family history of ischemic heart disease and other diseases of the circulatory system: Secondary | ICD-10-CM | POA: Diagnosis not present

## 2021-11-28 DIAGNOSIS — I080 Rheumatic disorders of both mitral and aortic valves: Secondary | ICD-10-CM | POA: Diagnosis not present

## 2021-11-28 DIAGNOSIS — I361 Nonrheumatic tricuspid (valve) insufficiency: Secondary | ICD-10-CM | POA: Diagnosis not present

## 2021-11-28 LAB — ECHOCARDIOGRAM COMPLETE
Area-P 1/2: 4.26 cm2
P 1/2 time: 459 msec
S' Lateral: 2.6 cm

## 2021-11-29 ENCOUNTER — Encounter (INDEPENDENT_AMBULATORY_CARE_PROVIDER_SITE_OTHER): Payer: Self-pay | Admitting: Ophthalmology

## 2021-11-29 ENCOUNTER — Ambulatory Visit (INDEPENDENT_AMBULATORY_CARE_PROVIDER_SITE_OTHER): Payer: Medicare HMO | Admitting: Ophthalmology

## 2021-11-29 DIAGNOSIS — H43812 Vitreous degeneration, left eye: Secondary | ICD-10-CM | POA: Diagnosis not present

## 2021-11-29 DIAGNOSIS — H35372 Puckering of macula, left eye: Secondary | ICD-10-CM | POA: Diagnosis not present

## 2021-11-29 NOTE — Progress Notes (Signed)
11/29/2021     CHIEF COMPLAINT Patient presents for  Chief Complaint  Patient presents with   Retina Follow Up      HISTORY OF PRESENT ILLNESS: Erin Cruz is a 76 y.o. female who presents to the clinic today for:   HPI     Retina Follow Up           Diagnosis: Other   Laterality: left eye   Severity: moderate   Course: stable         Comments   8 weeks for DILATE, OS, OCT. Pt stated no changes in vision. Pt denies new floaters.       Last edited by Silvestre Moment on 11/29/2021  9:32 AM.      Referring physician: Glenis Smoker, MD [place],  [place] [address]  HISTORICAL INFORMATION:   Selected notes from the MEDICAL RECORD NUMBER       CURRENT MEDICATIONS: Current Outpatient Medications (Ophthalmic Drugs)  Medication Sig   ketorolac (ACULAR) 0.5 % ophthalmic solution Place 1 drop into the left eye 4 (four) times daily for 600 doses.   No current facility-administered medications for this visit. (Ophthalmic Drugs)   Current Outpatient Medications (Other)  Medication Sig   aspirin EC 81 MG tablet Take 81 mg by mouth daily.   BIOTIN 5000 PO Take by mouth.   ezetimibe (ZETIA) 10 MG tablet TAKE 1 TABLET BY MOUTH DAILY   glucosamine-chondroitin 500-400 MG tablet Take 1 tablet by mouth 3 (three) times daily.   hydrochlorothiazide (MICROZIDE) 12.5 MG capsule TAKE ONE CAPSULE BY MOUTH DAILY   naproxen (NAPROSYN) 500 MG tablet Take 1 tablet (500 mg total) by mouth 2 (two) times daily as needed for mild pain, moderate pain or headache (TAKE WITH MEALS.). (Patient not taking: Reported on 02/20/2021)   omeprazole (PRILOSEC) 20 MG capsule 1 capsule 30 minutes before morning meal   rosuvastatin (CRESTOR) 40 MG tablet Take 1 tablet (40 mg total) by mouth daily.   No current facility-administered medications for this visit. (Other)      REVIEW OF SYSTEMS: ROS   Negative for: Constitutional, Gastrointestinal, Neurological, Skin,  Genitourinary, Musculoskeletal, HENT, Endocrine, Cardiovascular, Eyes, Respiratory, Psychiatric, Allergic/Imm, Heme/Lymph Last edited by Silvestre Moment on 11/29/2021  9:32 AM.       ALLERGIES Allergies  Allergen Reactions   Norco [Hydrocodone-Acetaminophen] Nausea And Vomiting   Morphine And Related Other (See Comments)    Patient stated that she can take this but she has to take it with a nausea medication    PAST MEDICAL HISTORY Past Medical History:  Diagnosis Date   Allergy    seasonal    Arthritis    hand    Cancer ([place]) 1998   neck basal cell   Cataract    left side removed    GERD (gastroesophageal reflux disease)    Hyperlipidemia    Osteopenia    Raynaud phenomenon    Past Surgical History:  Procedure Laterality Date   CATARACT EXTRACTION W/ INTRAOCULAR LENS IMPLANT Left 2004   left    COLONOSCOPY     [place], 2013 DB- normal    CYSTECTOMY  2008   benign from neck    [place]   ganglion cyst x 2   POPLITEAL SYNOVIAL CYST EXCISION  1961   left knee   SPINE SURGERY  07/04/2014   L4 and L5 disc replacement and fusion  L4-S1- Dr. Patrice Paradise   TUBAL LIGATION  7829   UMBILICAL HERNIA REPAIR  1979    FAMILY HISTORY Family History  Problem Relation Age of Onset   Colon cancer Father    Hyperlipidemia Father    Leukemia Father        due to treatment for colon cancer   Colon polyps Father    Colon cancer Maternal Grandmother    Muscular dystrophy Mother        IBM   Hypertension Sister    Breast cancer Paternal Aunt    Breast cancer Paternal Uncle    Esophageal cancer Neg Hx    Rectal cancer Neg Hx    Stomach cancer Neg Hx     SOCIAL HISTORY Social History   Tobacco Use   Smoking status: Former   Smokeless tobacco: Never   Tobacco comments:    a couple cigarettes a day  Vaping Use   Vaping Use: Never used  Substance Use Topics   Alcohol use: Yes    Alcohol/week: 2.0 standard drinks of alcohol    Types: 2  Glasses of wine per week   Drug use: No         OPHTHALMIC EXAM:  Base Eye Exam     Visual Acuity (ETDRS)       Right Left   Dist [place] 20/40 -2 20/25 -2   Dist ph [place] 20/20 -2          Tonometry (Tonopen, 9:38 AM)       Right Left   Pressure 10 9         Pupils       Pupils APD   Right PERRL None   Left PERRL None         Visual Fields       Left Right    Full Full         Extraocular Movement       Right Left    Full Full         Neuro/Psych     Oriented x3: Yes   Mood/Affect: Normal         Dilation     Left eye: 2.5% Phenylephrine, 1.0% Mydriacyl @ 9:38 AM           Slit Lamp and Fundus Exam     External Exam       Right Left   External Normal Normal         Slit Lamp Exam       Right Left   Lids/Lashes Normal Normal   Conjunctiva/Sclera White and quiet White and quiet   Cornea Clear Clear   Anterior Chamber Deep and quiet Deep and quiet   Iris Round and reactive Round and reactive, NO TI Defects   Lens Posterior chamber intraocular lens Posterior chamber intraocular lens   Anterior Vitreous Normal Normal         Fundus Exam       Right Left   Posterior Vitreous  Posterior vitreous detachment   Disc  Normal   C/D Ratio  0.4   Macula  Epiretinal membrane, Cystoid macular edema   Vessels  Normal   Periphery  Normal            IMAGING AND PROCEDURES  Imaging and Procedures for 11/29/21  OCT, Retina - OU - Both Eyes       Right Eye Quality was good. Scan locations included subfoveal. Central Foveal Thickness: 291. Progression has  been stable. Findings include normal foveal contour.   Left Eye Central Foveal Thickness: 360. Progression has worsened. Findings include abnormal foveal contour, cystoid macular edema, epiretinal membrane.   Notes  Moderate to severe epiretinal membrane remains, foveal depression now diminished and thickening secondary to ERM.  And only 1 slice of the finding in the left  eye has a CME perifoveal yet slightly enlarged as compared to February 2023.  Vastly improved overall on topical NSAIDs its onset of therapy November but worse over the last 6 weeks.  And now with symptomatology             ASSESSMENT/PLAN:  Left epiretinal membrane OS with severe epiretinal membrane triggering patient difficulty driving.  If the left eye was her only eye driving would not be possible per her symptoms.  She would like to proceed with vitrectomy membrane peel left eye" will the floaters to be removed", the answer was yes  Posterior vitreous detachment, left eye With central vitreous floaters     ICD-10-CM   1. Left epiretinal membrane  H35.372 OCT, Retina - OU - Both Eyes    2. Posterior vitreous detachment, left eye  H43.812       1.  Patient reports excellent compliance on topical NSAIDs 3 times daily.  Yet persistent and slightly increased CME and slight increase in thickening now proven to be persistent from secondary epiretinal membrane formation left eye.  Symptomatically patient unable to drive if the left eye was her only eye.  2.  OS incidentally patient also underwent home testing with a ring oximeter and confirmed she does not have signs of nightly hypoxia from occult or undiagnosed sleep apnea.  Thus this is not likely MAC-TEL 3.  OS, will schedule vitrectomy membrane peel.  Patient delighted that as a side effect of floaters will also be removed.  4.  Patient will return for preoperative visit to schedule vitrectomy membrane peel left eye in the near future  Ophthalmic Meds Ordered this visit:  No orders of the defined types were placed in this encounter.      Return ,, [facility], for Schedule vitrectomy, membrane peel-[id number], OS,, inject Kenalog.  There are no Patient Instructions on file for this visit.   Explained the diagnoses, plan, and follow up with the patient and they expressed understanding.  Patient expressed  understanding of the importance of proper follow up care.   Clent Demark Ayson Cherubini M.D. Diseases & Surgery of the Retina and Vitreous Retina & Diabetic [place] 11/29/21     Abbreviations: M myopia (nearsighted); A astigmatism; H hyperopia (farsighted); P presbyopia; Mrx spectacle prescription;  CTL contact lenses; OD right eye; OS left eye; OU both eyes  XT exotropia; ET esotropia; PEK punctate epithelial keratitis; PEE punctate epithelial erosions; DES dry eye syndrome; MGD meibomian gland dysfunction; ATs artificial tears; PFAT's preservative free artificial tears; [place] nuclear sclerotic cataract; PSC posterior subcapsular cataract; ERM epi-retinal membrane; PVD posterior vitreous detachment; RD retinal detachment; DM diabetes mellitus; DR diabetic retinopathy; NPDR non-proliferative diabetic retinopathy; PDR proliferative diabetic retinopathy; CSME clinically significant macular edema; DME diabetic macular edema; dbh dot blot hemorrhages; CWS cotton wool spot; POAG primary open angle glaucoma; C/D cup-to-disc ratio; HVF humphrey visual field; GVF goldmann visual field; OCT optical coherence tomography; IOP intraocular pressure; BRVO Branch retinal vein occlusion; CRVO central retinal vein occlusion; CRAO central retinal artery occlusion; BRAO branch retinal artery occlusion; RT retinal tear; SB scleral buckle; PPV pars plana vitrectomy; VH Vitreous hemorrhage; PRP  panretinal laser photocoagulation; IVK intravitreal kenalog; VMT vitreomacular traction; MH Macular hole;  NVD neovascularization of the disc; NVE neovascularization elsewhere; AREDS age related eye disease study; ARMD age related macular degeneration; POAG primary open angle glaucoma; EBMD epithelial/anterior basement membrane dystrophy; ACIOL anterior chamber intraocular lens; IOL intraocular lens; PCIOL posterior chamber intraocular lens; Phaco/IOL phacoemulsification with intraocular lens placement; [place] photorefractive keratectomy; LASIK laser  assisted in situ keratomileusis; HTN hypertension; DM diabetes mellitus; COPD chronic obstructive pulmonary disease

## 2021-11-29 NOTE — Assessment & Plan Note (Signed)
With central vitreous floaters

## 2021-11-29 NOTE — Assessment & Plan Note (Signed)
OS with severe epiretinal membrane triggering patient difficulty driving.  If the left eye was her only eye driving would not be possible per her symptoms.  She would like to proceed with vitrectomy membrane peel left eye" will the floaters to be removed", the answer was yes

## 2021-12-05 ENCOUNTER — Encounter (INDEPENDENT_AMBULATORY_CARE_PROVIDER_SITE_OTHER): Payer: Self-pay

## 2021-12-05 ENCOUNTER — Ambulatory Visit (INDEPENDENT_AMBULATORY_CARE_PROVIDER_SITE_OTHER): Payer: Medicare HMO

## 2021-12-05 MED ORDER — OFLOXACIN 0.3 % OP SOLN: 1.0000 [drp] | Freq: Four times a day (QID) | OPHTHALMIC | 0 refills | Status: AC

## 2021-12-05 MED ORDER — PREDNISOLONE ACETATE 1 % OP SUSP: 1.0000 [drp] | Freq: Four times a day (QID) | OPHTHALMIC | 0 refills | Status: AC

## 2021-12-05 NOTE — Progress Notes (Signed)
12/05/2021     CHIEF COMPLAINT Patient presents for Pre-op Exam   HISTORY OF PRESENT ILLNESS: Erin Cruz is a 76 y.o. female who presents to the clinic today for:   HPI   Pre Op OS sx 12/12/2021. Scheduled Vitrectomy, Membrane Peel, Left eye. Patient states vision is stable and unchanged since last visit. Denies any new floaters or FOL. Patient is using Ketorolac TID OS. Last edited by Laurin Coder on 12/05/2021 11:11 AM.        HISTORICAL INFORMATION:   Selected notes from the MEDICAL RECORD NUMBER       CURRENT MEDICATIONS: Current Outpatient Medications (Ophthalmic Drugs)  Medication Sig   ketorolac (ACULAR) 0.5 % ophthalmic solution Place 1 drop into the left eye 4 (four) times daily for 600 doses.   No current facility-administered medications for this visit. (Ophthalmic Drugs)   Current Outpatient Medications (Other)  Medication Sig   aspirin EC 81 MG tablet Take 81 mg by mouth daily.   BIOTIN 5000 PO Take by mouth.   ezetimibe (ZETIA) 10 MG tablet TAKE 1 TABLET BY MOUTH DAILY   glucosamine-chondroitin 500-400 MG tablet Take 1 tablet by mouth 3 (three) times daily.   hydrochlorothiazide (MICROZIDE) 12.5 MG capsule TAKE ONE CAPSULE BY MOUTH DAILY   naproxen (NAPROSYN) 500 MG tablet Take 1 tablet (500 mg total) by mouth 2 (two) times daily as needed for mild pain, moderate pain or headache (TAKE WITH MEALS.). (Patient not taking: Reported on 02/20/2021)   omeprazole (PRILOSEC) 20 MG capsule 1 capsule 30 minutes before morning meal   rosuvastatin (CRESTOR) 40 MG tablet Take 1 tablet (40 mg total) by mouth daily.   No current facility-administered medications for this visit. (Other)     ALLERGIES Allergies  Allergen Reactions   Norco [Hydrocodone-Acetaminophen] Nausea And Vomiting   Morphine And Related Other (See Comments)    Patient stated that she can take this but she has to take it with a nausea medication    PAST MEDICAL HISTORY Past Medical  History:  Diagnosis Date   Allergy    seasonal    Arthritis    hand    Cancer ([place]) 1998   neck basal cell   Cataract    left side removed    GERD (gastroesophageal reflux disease)    Hyperlipidemia    Osteopenia    Raynaud phenomenon    Past Surgical History:  Procedure Laterality Date   CATARACT EXTRACTION W/ INTRAOCULAR LENS IMPLANT Left 2004   left    COLONOSCOPY     2008 [place], 2013 DB- normal    CYSTECTOMY  2008   benign from neck    [place]   ganglion cyst x 2   POPLITEAL SYNOVIAL CYST EXCISION  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 Patrice Paradise   TUBAL LIGATION  2330   UMBILICAL HERNIA REPAIR  1979    FAMILY HISTORY Family History  Problem Relation Age of Onset   Colon cancer Father    Hyperlipidemia Father    Leukemia Father        due to treatment for colon cancer   Colon polyps Father    Colon cancer Maternal Grandmother    Muscular dystrophy Mother        IBM   Hypertension Sister    Breast cancer Paternal Aunt  Breast cancer Paternal Uncle    Esophageal cancer Neg Hx    Rectal cancer Neg Hx    Stomach cancer Neg Hx     SOCIAL HISTORY Social History   Tobacco Use   Smoking status: Former   Smokeless tobacco: Never   Tobacco comments:    a couple cigarettes a day  Vaping Use   Vaping Use: Never used  Substance Use Topics   Alcohol use: Yes    Alcohol/week: 2.0 standard drinks of alcohol    Types: 2 Glasses of wine per week   Drug use: No         OPHTHALMIC EXAM:  Base Eye Exam     Visual Acuity (ETDRS)       Right Left   Dist [place] 20/20 -2 20/30         Tonometry     Tonopen         Pupils       APD   Right None   Left None         Extraocular Movement       Right Left    Full Full         Neuro/Psych     Oriented x3: Yes   Mood/Affect: Normal         Dilation     Both eyes: No dilation @ 11:12 AM             IMAGING AND PROCEDURES  Imaging and Procedures for '@TODAY'$ @           ASSESSMENT/PLAN:  No diagnosis found.  Ophthalmic Meds Ordered this visit:  No orders of the defined types were placed in this encounter.       Pre-op completed. Operative consent obtained with pre-op eye drops reviewed with Rex Kras and sent via [facility] as needed. Post op instructions reviewed with patient and per patient all questions answered.  Laurin Coder

## 2021-12-12 ENCOUNTER — Encounter (AMBULATORY_SURGERY_CENTER): Payer: Medicare HMO | Admitting: Ophthalmology

## 2021-12-12 DIAGNOSIS — H35372 Puckering of macula, left eye: Secondary | ICD-10-CM | POA: Diagnosis not present

## 2021-12-13 ENCOUNTER — Encounter (INDEPENDENT_AMBULATORY_CARE_PROVIDER_SITE_OTHER): Payer: Self-pay | Admitting: Ophthalmology

## 2021-12-13 ENCOUNTER — Ambulatory Visit (INDEPENDENT_AMBULATORY_CARE_PROVIDER_SITE_OTHER): Payer: Medicare HMO | Admitting: Ophthalmology

## 2021-12-13 DIAGNOSIS — H35372 Puckering of macula, left eye: Secondary | ICD-10-CM

## 2021-12-13 NOTE — Assessment & Plan Note (Signed)
Day #1 looks great post vitrectomy membrane peel, much less topographic distortion, typical post ILM removal changes on macula

## 2021-12-13 NOTE — Patient Instructions (Signed)

## 2021-12-13 NOTE — Progress Notes (Signed)
12/13/2021     CHIEF COMPLAINT Patient presents for  Chief Complaint  Patient presents with   Post-op Follow-up      HISTORY OF PRESENT ILLNESS: Erin Cruz is a 76 y.o. female who presents to the clinic today for:   HPI     Post-op Follow-up           Laterality: left eye   Discomfort: tearing.  Negative for pain, itching, foreign body sensation, discharge and floaters   Vision: is improved and is blurred at distance         Comments   1 day PO OS  vit membrane peel sx 12/12/2021. Patient reports it is blurred, no more floaters, no distortion. Patient reports she has the Ofloxacin and Prednisolone OS QID, Ketorolac OS TID.      Last edited by Hurman Horn, MD on 12/13/2021  8:46 AM.      Referring physician: Glenis Smoker, MD [place],  [place] [address]  HISTORICAL INFORMATION:   Selected notes from the MEDICAL RECORD NUMBER       CURRENT MEDICATIONS: Current Outpatient Medications (Ophthalmic Drugs)  Medication Sig   ketorolac (ACULAR) 0.5 % ophthalmic solution Place 1 drop into the left eye 4 (four) times daily for 600 doses.   ofloxacin (OCUFLOX) 0.3 % ophthalmic solution Place 1 drop into the left eye in the morning, at noon, in the evening, and at bedtime for 21 days.   prednisoLONE acetate (PRED FORTE) 1 % ophthalmic suspension Place 1 drop into the left eye 4 (four) times daily for 21 days.   No current facility-administered medications for this visit. (Ophthalmic Drugs)   Current Outpatient Medications (Other)  Medication Sig   aspirin EC 81 MG tablet Take 81 mg by mouth daily.   BIOTIN 5000 PO Take by mouth.   ezetimibe (ZETIA) 10 MG tablet TAKE 1 TABLET BY MOUTH DAILY   glucosamine-chondroitin 500-400 MG tablet Take 1 tablet by mouth 3 (three) times daily.   hydrochlorothiazide (MICROZIDE) 12.5 MG capsule TAKE ONE CAPSULE BY MOUTH DAILY   naproxen (NAPROSYN) 500 MG tablet Take 1 tablet (500 mg total) by mouth 2  (two) times daily as needed for mild pain, moderate pain or headache (TAKE WITH MEALS.). (Patient not taking: Reported on 02/20/2021)   omeprazole (PRILOSEC) 20 MG capsule 1 capsule 30 minutes before morning meal   rosuvastatin (CRESTOR) 40 MG tablet Take 1 tablet (40 mg total) by mouth daily.   No current facility-administered medications for this visit. (Other)      REVIEW OF SYSTEMS: ROS   Negative for: Constitutional, Gastrointestinal, Neurological, Skin, Genitourinary, Musculoskeletal, HENT, Endocrine, Cardiovascular, Eyes, Respiratory, Psychiatric, Allergic/Imm, Heme/Lymph Last edited by Hurman Horn, MD on 12/13/2021  8:46 AM.       ALLERGIES Allergies  Allergen Reactions   Norco [Hydrocodone-Acetaminophen] Nausea And Vomiting   Morphine And Related Other (See Comments)    Patient stated that she can take this but she has to take it with a nausea medication    PAST MEDICAL HISTORY Past Medical History:  Diagnosis Date   Allergy    seasonal    Arthritis    hand    Cancer ([place]) 1998   neck basal cell   Cataract    left side removed    GERD (gastroesophageal reflux disease)    Hyperlipidemia    Osteopenia    Raynaud phenomenon    Past Surgical History:  Procedure Laterality Date  CATARACT EXTRACTION W/ INTRAOCULAR LENS IMPLANT Left 2004   left    COLONOSCOPY     [place] 2013 [place]- normal    CYSTECTOMY  2008   benign from neck    [place]   ganglion cyst x 2   POPLITEAL SYNOVIAL CYST EXCISION  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 Patrice Paradise   TUBAL LIGATION  2993   UMBILICAL HERNIA REPAIR  1979    FAMILY HISTORY Family History  Problem Relation Age of Onset   Colon cancer Father    Hyperlipidemia Father    Leukemia Father        due to treatment for colon cancer   Colon polyps Father    Colon cancer Maternal Grandmother    Muscular dystrophy Mother         IBM   Hypertension Sister    Breast cancer Paternal Aunt    Breast cancer Paternal Uncle    Esophageal cancer Neg Hx    Rectal cancer Neg Hx    Stomach cancer Neg Hx     SOCIAL HISTORY Social History   Tobacco Use   Smoking status: Former   Smokeless tobacco: Never   Tobacco comments:    a couple cigarettes a day  Vaping Use   Vaping Use: Never used  Substance Use Topics   Alcohol use: Yes    Alcohol/week: 2.0 standard drinks of alcohol    Types: 2 Glasses of wine per week   Drug use: No         OPHTHALMIC EXAM:  Base Eye Exam     Visual Acuity (ETDRS)       Right Left   Dist [place] 20/20 -2 20/50 -1   Dist ph [place]  20/30 +2         Tonometry (Tonopen, 8:29 AM)       Right Left   Pressure 6 9         Pupils       Dark Light React APD   Right 4 3 Brisk None   Left [employer]. Dilated  Minimal None         Extraocular Movement       Right Left    Full Full         Neuro/Psych     Oriented x3: Yes   Mood/Affect: Normal         Dilation     Left eye: 1.0% Mydriacyl, 2.5% Phenylephrine @ 8:29 AM           Slit Lamp and Fundus Exam     External Exam       Right Left   External Normal Normal         Slit Lamp Exam       Right Left   Lids/Lashes Normal Normal   Conjunctiva/Sclera White and quiet White and quiet   Cornea Clear Clear   Anterior Chamber Deep and quiet Deep and quiet   Iris Round and reactive Round and reactive, NO TI Defects   Lens Posterior chamber intraocular lens Posterior chamber intraocular lens, 1+ Posterior capsular opacification   Anterior Vitreous Normal Normal         Fundus Exam       Right Left   Posterior Vitreous  Clear, avitric   Disc  Normal   C/D  Ratio  0.4   Macula  Less topographic distortion, post ILM removal changes   Vessels  Normal   Periphery  Normal            IMAGING AND PROCEDURES  Imaging and Procedures for 12/13/21           ASSESSMENT/PLAN:  Left  epiretinal membrane Day #1 looks great post vitrectomy membrane peel, much less topographic distortion, typical post ILM removal changes on macula     ICD-10-CM   1. Left epiretinal membrane  H35.372       1.  Patient to resume topical medications left eye today  2.  OS looks great today with clear media clear avitric  3.  Ophthalmic Meds Ordered this visit:  No orders of the defined types were placed in this encounter.      Return in about 1 week (around 12/20/2021) for dilate, OS, POST OP, OCT.  Patient Instructions  Ofloxacin  4 times daily to the operative eye  Prednisolone acetate 1 drop to the operative eye 4 times daily  Patient instructed not to refill the medications and use them for maximum of 3 weeks.  Patient instructed do not rub the eye.  Patient has the option to use the patch at night.   No lifting and bending for 1 week. No water IN the eye for 10 days. Do not rub the eye. Wear shield at night for 1-3 days.  Continue your topical medications for a total of 3 weeks.  Do not refill your postoperative medications unless instructed.  Refrain from exercise or intentional activity which increases our heart rate above resting levels.  Normal walking to complete normal activities of your day are appropriate.  Driving:  Legally, you only need one good eye, of 20/40 or better to drive.  However, the practice does not recommend driving during first weeks after surgery, IF you are uncomfortable with your visual functioning or capabilities.   If you have known sleep apnea, wear your CPAP as you normally should.   Explained the diagnoses, plan, and follow up with the patient and they expressed understanding.  Patient expressed understanding of the importance of proper follow up care.   Clent Demark Taft Worthing M.D. Diseases & Surgery of the Retina and Vitreous Retina & Diabetic [place] 12/13/21     Abbreviations: M myopia (nearsighted); A astigmatism; H hyperopia  (farsighted); P presbyopia; Mrx spectacle prescription;  CTL contact lenses; OD right eye; OS left eye; OU both eyes  XT exotropia; ET esotropia; PEK punctate epithelial keratitis; PEE punctate epithelial erosions; DES dry eye syndrome; MGD meibomian gland dysfunction; ATs artificial tears; PFAT's preservative free artificial tears; [place] nuclear sclerotic cataract; PSC posterior subcapsular cataract; ERM epi-retinal membrane; PVD posterior vitreous detachment; RD retinal detachment; DM diabetes mellitus; DR diabetic retinopathy; NPDR non-proliferative diabetic retinopathy; PDR proliferative diabetic retinopathy; CSME clinically significant macular edema; DME diabetic macular edema; dbh dot blot hemorrhages; CWS cotton wool spot; POAG primary open angle glaucoma; C/D cup-to-disc ratio; HVF humphrey visual field; GVF goldmann visual field; OCT optical coherence tomography; IOP intraocular pressure; BRVO Branch retinal vein occlusion; CRVO central retinal vein occlusion; CRAO central retinal artery occlusion; BRAO branch retinal artery occlusion; RT retinal tear; SB scleral buckle; PPV pars plana vitrectomy; VH Vitreous hemorrhage; PRP panretinal laser photocoagulation; IVK intravitreal kenalog; VMT vitreomacular traction; MH Macular hole;  NVD neovascularization of the disc; NVE neovascularization elsewhere; AREDS age related eye disease study; ARMD age related macular degeneration; POAG primary open angle glaucoma;  EBMD epithelial/anterior basement membrane dystrophy; ACIOL anterior chamber intraocular lens; IOL intraocular lens; PCIOL posterior chamber intraocular lens; Phaco/IOL phacoemulsification with intraocular lens placement; [place] photorefractive keratectomy; LASIK laser assisted in situ keratomileusis; HTN hypertension; DM diabetes mellitus; COPD chronic obstructive pulmonary disease

## 2021-12-19 ENCOUNTER — Encounter (INDEPENDENT_AMBULATORY_CARE_PROVIDER_SITE_OTHER): Payer: Self-pay | Admitting: Ophthalmology

## 2021-12-19 ENCOUNTER — Ambulatory Visit (INDEPENDENT_AMBULATORY_CARE_PROVIDER_SITE_OTHER): Payer: Medicare HMO | Admitting: Ophthalmology

## 2021-12-19 DIAGNOSIS — H35352 Cystoid macular degeneration, left eye: Secondary | ICD-10-CM

## 2021-12-19 DIAGNOSIS — H35372 Puckering of macula, left eye: Secondary | ICD-10-CM | POA: Diagnosis not present

## 2021-12-19 DIAGNOSIS — H43812 Vitreous degeneration, left eye: Secondary | ICD-10-CM

## 2021-12-19 NOTE — Assessment & Plan Note (Signed)
OS doing very well with improvement in macular condition macular anatomy and now return to preoperative vision.  At 1 week post vitrectomy ILM peel

## 2021-12-19 NOTE — Patient Instructions (Signed)
Ofloxacin  4 times daily to the operative eye  Prednisolone acetate 1 drop to the operative eye 4 times daily  Patient instructed not to refill the medications and use them for maximum of 3 weeks.  Patient instructed do not rub the eye.  Patient has the option to use the patch at night.   At the end of 2 weeks from now all medication should be discontinued left eye with the exception of ketorolac  At the end of 2 weeks and now, ketorolac 1 drop left eye twice daily until return   Patient may resume full activity in 4 days from now   No lifting and bending for 1 week. No water IN the eye for 10 days. Do not rub the eye. Wear shield at night for 1-3 days.  Continue your topical medications for a total of 3 weeks.  Do not refill your postoperative medications unless instructed.  Refrain from exercise or intentional activity which increases our heart rate above resting levels.  Normal walking to complete normal activities of your day are appropriate.  Driving:  Legally, you only need one good eye, of 20/40 or better to drive.  However, the practice does not recommend driving during first weeks after surgery, IF you are uncomfortable with your visual functioning or capabilities.   If you have known sleep apnea, wear your CPAP as you normally should.

## 2021-12-19 NOTE — Progress Notes (Signed)
12/19/2021     CHIEF COMPLAINT Patient presents for  Chief Complaint  Patient presents with   Post-op Follow-up      HISTORY OF PRESENT ILLNESS: Erin Cruz is a 76 y.o. female who presents to the clinic today for:   HPI     Post-op Follow-up           Laterality: left eye   Discomfort: Negative for pain, itching, foreign body sensation, tearing, discharge and floaters   Vision: is improved         Comments   1 week for dilate OS, POST OP, OCT. Pt stated, "when I look at something straight, there was a little wavy line. Its liek thers a little bubble at the top." Pt stated vision has improved.         Last edited by Silvestre Moment on 12/19/2021  8:14 AM.      Referring physician: Glenis Smoker, MD [place],  [place] [id number]  HISTORICAL INFORMATION:   Selected notes from the MEDICAL RECORD NUMBER       CURRENT MEDICATIONS: Current Outpatient Medications (Ophthalmic Drugs)  Medication Sig   ketorolac (ACULAR) 0.5 % ophthalmic solution Place 1 drop into the left eye 4 (four) times daily for 600 doses.   ofloxacin (OCUFLOX) 0.3 % ophthalmic solution Place 1 drop into the left eye in the morning, at noon, in the evening, and at bedtime for 21 days.   prednisoLONE acetate (PRED FORTE) 1 % ophthalmic suspension Place 1 drop into the left eye 4 (four) times daily for 21 days.   No current facility-administered medications for this visit. (Ophthalmic Drugs)   Current Outpatient Medications (Other)  Medication Sig   aspirin EC 81 MG tablet Take 81 mg by mouth daily.   BIOTIN 5000 PO Take by mouth.   ezetimibe (ZETIA) 10 MG tablet TAKE 1 TABLET BY MOUTH DAILY   glucosamine-chondroitin 500-400 MG tablet Take 1 tablet by mouth 3 (three) times daily.   hydrochlorothiazide (MICROZIDE) 12.5 MG capsule TAKE ONE CAPSULE BY MOUTH DAILY   naproxen (NAPROSYN) 500 MG tablet Take 1 tablet (500 mg total) by mouth 2 (two) times daily as needed for  mild pain, moderate pain or headache (TAKE WITH MEALS.). (Patient not taking: Reported on 02/20/2021)   omeprazole (PRILOSEC) 20 MG capsule 1 capsule 30 minutes before morning meal   rosuvastatin (CRESTOR) 40 MG tablet Take 1 tablet (40 mg total) by mouth daily.   No current facility-administered medications for this visit. (Other)      REVIEW OF SYSTEMS: ROS   Negative for: Constitutional, Gastrointestinal, Neurological, Skin, Genitourinary, Musculoskeletal, HENT, Endocrine, Cardiovascular, Eyes, Respiratory, Psychiatric, Allergic/Imm, Heme/Lymph Last edited by Silvestre Moment on 12/19/2021  8:14 AM.       ALLERGIES Allergies  Allergen Reactions   Norco [Hydrocodone-Acetaminophen] Nausea And Vomiting   Morphine And Related Other (See Comments)    Patient stated that she can take this but she has to take it with a nausea medication    PAST MEDICAL HISTORY Past Medical History:  Diagnosis Date   Allergy    seasonal    Arthritis    hand    Cancer ([place]) 1998   neck basal cell   Cataract    left side removed    GERD (gastroesophageal reflux disease)    Hyperlipidemia    Osteopenia    Raynaud phenomenon    Past Surgical History:  Procedure Laterality Date   CATARACT EXTRACTION W/ INTRAOCULAR  LENS IMPLANT Left 2004   left    COLONOSCOPY     [place], 2013 DB- normal    CYSTECTOMY  2008   benign from neck    [place]   ganglion cyst x 2   POPLITEAL SYNOVIAL CYST EXCISION  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 Patrice Paradise   TUBAL LIGATION  7253   UMBILICAL HERNIA REPAIR  1979    FAMILY HISTORY Family History  Problem Relation Age of Onset   Colon cancer Father    Hyperlipidemia Father    Leukemia Father        due to treatment for colon cancer   Colon polyps Father    Colon cancer Maternal Grandmother    Muscular dystrophy Mother        [employer]   Hypertension Sister     Breast cancer Paternal Aunt    Breast cancer Paternal Uncle    Esophageal cancer Neg Hx    Rectal cancer Neg Hx    Stomach cancer Neg Hx     SOCIAL HISTORY Social History   Tobacco Use   Smoking status: Former   Smokeless tobacco: Never   Tobacco comments:    a couple cigarettes a day  Vaping Use   Vaping Use: Never used  Substance Use Topics   Alcohol use: Yes    Alcohol/week: 2.0 standard drinks of alcohol    Types: 2 Glasses of wine per week   Drug use: No         OPHTHALMIC EXAM:  Base Eye Exam     Visual Acuity (ETDRS)       Right Left   Dist [place] 20/25 -1 20/30 -1         Tonometry (Tonopen, 8:18 AM)       Right Left   Pressure 13 14         Pupils       Pupils APD   Right PERRL None   Left PERRL None         Visual Fields       Left Right    Full Full         Extraocular Movement       Right Left    Full Full         Neuro/Psych     Oriented x3: Yes   Mood/Affect: Normal         Dilation     Left eye:            Slit Lamp and Fundus Exam     External Exam       Right Left   External Normal Normal         Slit Lamp Exam       Right Left   Lids/Lashes Normal Normal   Conjunctiva/Sclera White and quiet White and quiet   Cornea Clear Clear   Anterior Chamber Deep and quiet Deep and quiet   Iris Round and reactive Round and reactive, NO TI Defects   Lens Posterior chamber intraocular lens Posterior chamber intraocular lens, 1+ Posterior capsular opacification   Anterior Vitreous Normal Normal         Fundus Exam       Right Left   Posterior Vitreous  Clear, avitric   Disc  Normal   C/D Ratio  0.4   Macula  Less topographic distortion,    Vessels  Normal   Periphery  Normal            IMAGING AND PROCEDURES  Imaging and Procedures for 12/19/21  OCT, Retina - OU - Both Eyes       Right Eye Quality was good. Scan locations included subfoveal. Central Foveal Thickness: 291. Progression  has been stable. Findings include normal foveal contour.   Left Eye Central Foveal Thickness: 370. Progression has improved. Findings include abnormal foveal contour, cystoid macular edema, epiretinal membrane.   Notes OS, now with return of foveal depression.  Still moderate thickening remains but 1 week post vitrectomy membrane peel left eye       Color Fundus Photography Optos - OU - Both Eyes       Right Eye Disc findings include normal observations. Macula : normal observations. Vessels : normal observations. Periphery : normal observations.   Left Eye Progression has been stable. Disc findings include normal observations. Periphery : normal observations.   Notes Clear media              ASSESSMENT/PLAN:  Left epiretinal membrane OS doing very well with improvement in macular condition macular anatomy and now return to preoperative vision.  At 1 week post vitrectomy ILM peel    Posterior vitreous detachment, left eye Floaters resolved post vitrectomy  Cystoid macular edema of left eye OS improving.  We will continue ketorolac twice daily     ICD-10-CM   1. Left epiretinal membrane  H35.372 OCT, Retina - OU - Both Eyes    Color Fundus Photography Optos - OU - Both Eyes    2. Posterior vitreous detachment, left eye  H43.812     3. Cystoid macular edema of left eye  H35.352       1.  OS vastly improved at only 1 week post vitrectomy membrane peel for severe epiretinal membrane with secondary CME.  2.  Instructions are listed below for topical medications to the left eye.  3.  Ophthalmic Meds Ordered this visit:  No orders of the defined types were placed in this encounter.      Return in about 6 weeks (around 01/30/2022) for dilate, OS, OCT.  Patient Instructions  Ofloxacin  4 times daily to the operative eye  Prednisolone acetate 1 drop to the operative eye 4 times daily  Patient instructed not to refill the medications and use them for  maximum of 3 weeks.  Patient instructed do not rub the eye.  Patient has the option to use the patch at night.   At the end of 2 weeks from now all medication should be discontinued left eye with the exception of ketorolac  At the end of 2 weeks and now, ketorolac 1 drop left eye twice daily until return   Patient may resume full activity in 4 days from now   No lifting and bending for 1 week. No water IN the eye for 10 days. Do not rub the eye. Wear shield at night for 1-3 days.  Continue your topical medications for a total of 3 weeks.  Do not refill your postoperative medications unless instructed.  Refrain from exercise or intentional activity which increases our heart rate above resting levels.  Normal walking to complete normal activities of your day are appropriate.  Driving:  Legally, you only need one good eye, of 20/40 or better to drive.  However, the practice does not recommend driving during  first weeks after surgery, IF you are uncomfortable with your visual functioning or capabilities.   If you have known sleep apnea, wear your CPAP as you normally should.    Explained the diagnoses, plan, and follow up with the patient and they expressed understanding.  Patient expressed understanding of the importance of proper follow up care.   Clent Demark Trew Sunde M.D. Diseases & Surgery of the Retina and Vitreous Retina & Diabetic [place] 12/19/21     Abbreviations: M myopia (nearsighted); A astigmatism; H hyperopia (farsighted); P presbyopia; Mrx spectacle prescription;  CTL contact lenses; OD right eye; OS left eye; OU both eyes  XT exotropia; ET esotropia; PEK punctate epithelial keratitis; PEE punctate epithelial erosions; DES dry eye syndrome; MGD meibomian gland dysfunction; ATs artificial tears; PFAT's preservative free artificial tears; [place] nuclear sclerotic cataract; PSC posterior subcapsular cataract; ERM epi-retinal membrane; PVD posterior vitreous detachment; RD retinal  detachment; DM diabetes mellitus; DR diabetic retinopathy; NPDR non-proliferative diabetic retinopathy; PDR proliferative diabetic retinopathy; CSME clinically significant macular edema; DME diabetic macular edema; dbh dot blot hemorrhages; CWS cotton wool spot; POAG primary open angle glaucoma; C/D cup-to-disc ratio; HVF humphrey visual field; GVF goldmann visual field; OCT optical coherence tomography; IOP intraocular pressure; BRVO Branch retinal vein occlusion; CRVO central retinal vein occlusion; CRAO central retinal artery occlusion; BRAO branch retinal artery occlusion; RT retinal tear; SB scleral buckle; PPV pars plana vitrectomy; VH Vitreous hemorrhage; PRP panretinal laser photocoagulation; IVK intravitreal kenalog; VMT vitreomacular traction; MH Macular hole;  NVD neovascularization of the disc; NVE neovascularization elsewhere; AREDS age related eye disease study; ARMD age related macular degeneration; POAG primary open angle glaucoma; EBMD epithelial/anterior basement membrane dystrophy; ACIOL anterior chamber intraocular lens; IOL intraocular lens; PCIOL posterior chamber intraocular lens; Phaco/IOL phacoemulsification with intraocular lens placement; [place] photorefractive keratectomy; LASIK laser assisted in situ keratomileusis; HTN hypertension; DM diabetes mellitus; COPD chronic obstructive pulmonary disease

## 2021-12-19 NOTE — Assessment & Plan Note (Addendum)
OS improving.  We will continue ketorolac twice daily

## 2021-12-19 NOTE — Assessment & Plan Note (Signed)
Floaters resolved post vitrectomy

## 2021-12-27 ENCOUNTER — Ambulatory Visit (INDEPENDENT_AMBULATORY_CARE_PROVIDER_SITE_OTHER): Payer: Medicare HMO | Admitting: Ophthalmology

## 2021-12-27 ENCOUNTER — Encounter (INDEPENDENT_AMBULATORY_CARE_PROVIDER_SITE_OTHER): Payer: Self-pay | Admitting: Ophthalmology

## 2021-12-27 DIAGNOSIS — H538 Other visual disturbances: Secondary | ICD-10-CM

## 2021-12-27 DIAGNOSIS — H35372 Puckering of macula, left eye: Secondary | ICD-10-CM

## 2021-12-27 DIAGNOSIS — H35352 Cystoid macular degeneration, left eye: Secondary | ICD-10-CM

## 2021-12-27 DIAGNOSIS — H43812 Vitreous degeneration, left eye: Secondary | ICD-10-CM

## 2021-12-27 NOTE — Progress Notes (Signed)
12/27/2021     CHIEF COMPLAINT Patient presents for  Chief Complaint  Patient presents with   Retina Evaluation      HISTORY OF PRESENT ILLNESS: Erin Cruz is a 76 y.o. female who presents to the clinic today for:   HPI     Retina Evaluation           Laterality: left eye   Onset: 1 day ago   Treatments tried: no treatments         Comments   WIP- vision worsening, blurry OS Pt states her vision has got worse since Tuesday morning Pt states no new floaters or FOL Pt states she has "two retina headaches in one day and that has never happened.  Patient is seeing a gray diffuse gray foggy but not black can see through it "" but still out of focus seemingly        Last edited by Hurman Horn, MD on 12/27/2021  4:47 PM.      Referring physician: Glenis Smoker, MD [place],  [place] [id number]  HISTORICAL INFORMATION:   Selected notes from the MEDICAL RECORD NUMBER       CURRENT MEDICATIONS: Current Outpatient Medications (Ophthalmic Drugs)  Medication Sig   ketorolac (ACULAR) 0.5 % ophthalmic solution Place 1 drop into the left eye 4 (four) times daily for 600 doses.   No current facility-administered medications for this visit. (Ophthalmic Drugs)   Current Outpatient Medications (Other)  Medication Sig   aspirin EC 81 MG tablet Take 81 mg by mouth daily.   BIOTIN 5000 PO Take by mouth.   ezetimibe (ZETIA) 10 MG tablet TAKE 1 TABLET BY MOUTH DAILY   glucosamine-chondroitin 500-400 MG tablet Take 1 tablet by mouth 3 (three) times daily.   hydrochlorothiazide (MICROZIDE) 12.5 MG capsule TAKE ONE CAPSULE BY MOUTH DAILY   naproxen (NAPROSYN) 500 MG tablet Take 1 tablet (500 mg total) by mouth 2 (two) times daily as needed for mild pain, moderate pain or headache (TAKE WITH MEALS.). (Patient not taking: Reported on 02/20/2021)   omeprazole (PRILOSEC) 20 MG capsule 1 capsule 30 minutes before morning meal   rosuvastatin (CRESTOR)  40 MG tablet Take 1 tablet (40 mg total) by mouth daily.   No current facility-administered medications for this visit. (Other)      REVIEW OF SYSTEMS: ROS   Negative for: Constitutional, Gastrointestinal, Neurological, Skin, Genitourinary, Musculoskeletal, HENT, Endocrine, Cardiovascular, Eyes, Respiratory, Psychiatric, Allergic/Imm, Heme/Lymph Last edited by Morene Rankins, CMA on 12/27/2021  3:46 PM.       ALLERGIES Allergies  Allergen Reactions   Norco [Hydrocodone-Acetaminophen] Nausea And Vomiting   Morphine And Related Other (See Comments)    Patient stated that she can take this but she has to take it with a nausea medication    PAST MEDICAL HISTORY Past Medical History:  Diagnosis Date   Allergy    seasonal    Arthritis    hand    Cancer ([place]) 1998   neck basal cell   Cataract    left side removed    GERD (gastroesophageal reflux disease)    Hyperlipidemia    Osteopenia    Raynaud phenomenon    Past Surgical History:  Procedure Laterality Date   CATARACT EXTRACTION W/ INTRAOCULAR LENS IMPLANT Left 2004   left    [place]ONOSCOPY     [place] 2013 [place]- normal    CYSTECTOMY  2008   benign from neck  EYE SURGERY     HAND [place]   ganglion cyst x 2   POPLITEAL SYNOVIAL CYST EXCISION  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 Patrice Paradise   TUBAL LIGATION  7824   UMBILICAL HERNIA REPAIR  1979    FAMILY HISTORY Family History  Problem Relation Age of Onset   Colon cancer Father    Hyperlipidemia Father    Leukemia Father        due to treatment for colon cancer   Colon polyps Father    Colon cancer Maternal Grandmother    Muscular dystrophy Mother        [employer]   Hypertension Sister    Breast cancer Paternal Aunt    Breast cancer Paternal Uncle    Esophageal cancer Neg Hx    Rectal cancer Neg Hx    Stomach cancer Neg Hx     SOCIAL HISTORY Social History   Tobacco Use    Smoking status: Former   Smokeless tobacco: Never   Tobacco comments:    a couple cigarettes a day  Vaping Use   Vaping Use: Never used  Substance Use Topics   Alcohol use: Yes    Alcohol/week: 2.0 standard drinks of alcohol    Types: 2 Glasses of wine per week   Drug use: No         OPHTHALMIC EXAM:  Base Eye Exam     Visual Acuity (Snellen - Linear)       Right Left   Dist [place] 20/25 20/60  Panel acuity not checked OS prior to dilation, I checked the Acuity dilated and 20/60 with pinhole 20/60+1        Tonometry (Tonopen, 3:51 PM)       Right Left   Pressure 5 10         Neuro/Psych     Oriented x3: Yes   Mood/Affect: Normal         Dilation     Left eye: 1.0% Mydriacyl, 2.5% Phenylephrine @ 3:51 PM           Slit Lamp and Fundus Exam     External Exam       Right Left   External Normal Normal         Slit Lamp Exam       Right Left   Lids/Lashes Normal Normal   Conjunctiva/Sclera White and quiet White and quiet   Cornea Clear Clear   Anterior Chamber Deep and quiet Deep and quiet   Iris Round and reactive Round and reactive, NO TI Defects   Lens Posterior chamber intraocular lens Posterior chamber intraocular lens, 1+ Posterior capsular opacification   Anterior Vitreous Normal Normal         Fundus Exam       Right Left   Posterior Vitreous  Clear, avitric   Disc  Normal   C/D Ratio  0.4   Macula  Less topographic distortion,    Vessels  Normal   Periphery  Normal            IMAGING AND PROCEDURES  Imaging and Procedures for 12/27/21  Color Fundus Photography Optos - OU - Both Eyes       Right Eye Disc findings include normal observations. Macula : normal observations. Vessels : normal observations. Periphery : normal observations.   Left Eye Progression has been stable. Disc findings  include normal observations. Periphery : normal observations.   Notes Clear media     OCT, Retina - OU - Both Eyes        Right Eye Quality was good. Scan locations included subfoveal. Central Foveal Thickness: 291. Progression has been stable. Findings include normal foveal contour.   Left Eye Quality was borderline. Central Foveal Thickness: 370. Progression has improved. Findings include abnormal foveal contour.   Notes Clear media.  Nicely improved OS, no residual CME no residual ERM             ASSESSMENT/PLAN:  Cystoid macular edema of left eye 2 weeks post vitrectomy with improving CME and improving center foveal thickness post membrane peel  Left epiretinal membrane Anatomy continues to improve  Posterior vitreous detachment, left eye OS, resolved post retracted  Blurred vision, left eye No specific medial opacity, no specific corneal opacity no specific posterior capsule opacity, minor posterior capsule opacity is present but not likely to cause this degree of fogging.  We will continue to monitor that.  There is no vascular abnormality seen posteriorly on examination.  OCT is entirely normal with improving macular features post vitrectomy ILM peel and lessening of CME.     ICD-10-CM   1. Left epiretinal membrane  H35.372 Color Fundus Photography Optos - OU - Both Eyes    OCT, Retina - OU - Both Eyes    2. Cystoid macular edema of left eye  H35.352     3. Posterior vitreous detachment, left eye  H43.812     4. Blurred vision, left eye  H53.8       1.  2.  3.  Ophthalmic Meds Ordered this visit:  No orders of the defined types were placed in this encounter.      Return in about 1 week (around 01/03/2022) for dilate, OS, OCT.  Patient Instructions  Discontinue ketorolac left eye  Decrease prednisolone acetate white or pink top to once daily left eye  Continue ofloxacin 1 drop left eye only twice daily  Do not mash compress or rub the eye and sleep with a hard patch for the next 2 nights  Patient instructed to sleep with a heart patch for the next 2  nights  Explained the diagnoses, plan, and follow up with the patient and they expressed understanding.  Patient expressed understanding of the importance of proper follow up care.   Clent Demark Arham Symmonds M.D. Diseases & Surgery of the Retina and Vitreous Retina & Diabetic [place] 12/27/21     Abbreviations: M myopia (nearsighted); A astigmatism; H hyperopia (farsighted); P presbyopia; Mrx spectacle prescription;  CTL contact lenses; OD right eye; OS left eye; OU both eyes  XT exotropia; ET esotropia; PEK punctate epithelial keratitis; PEE punctate epithelial erosions; DES dry eye syndrome; MGD meibomian gland dysfunction; ATs artificial tears; PFAT's preservative free artificial tears; [place] nuclear sclerotic cataract; PSC posterior subcapsular cataract; ERM epi-retinal membrane; PVD posterior vitreous detachment; RD retinal detachment; DM diabetes mellitus; DR diabetic retinopathy; NPDR non-proliferative diabetic retinopathy; PDR proliferative diabetic retinopathy; CSME clinically significant macular edema; DME diabetic macular edema; dbh dot blot hemorrhages; CWS cotton wool spot; POAG primary open angle glaucoma; C/D cup-to-disc ratio; HVF humphrey visual field; GVF goldmann visual field; OCT optical coherence tomography; IOP intraocular pressure; BRVO Branch retinal vein occlusion; CRVO central retinal vein occlusion; CRAO central retinal artery occlusion; BRAO branch retinal artery occlusion; RT retinal tear; SB scleral buckle; PPV pars plana vitrectomy; VH Vitreous hemorrhage; PRP panretinal laser photocoagulation;  IVK intravitreal kenalog; VMT vitreomacular traction; MH Macular hole;  NVD neovascularization of the disc; NVE neovascularization elsewhere; AREDS age related eye disease study; ARMD age related macular degeneration; POAG primary open angle glaucoma; EBMD epithelial/anterior basement membrane dystrophy; ACIOL anterior chamber intraocular lens; IOL intraocular lens; PCIOL posterior chamber  intraocular lens; Phaco/IOL phacoemulsification with intraocular lens placement; [place] photorefractive keratectomy; LASIK laser assisted in situ keratomileusis; HTN hypertension; DM diabetes mellitus; COPD chronic obstructive pulmonary disease

## 2021-12-27 NOTE — Assessment & Plan Note (Signed)
2 weeks post vitrectomy with improving CME and improving center foveal thickness post membrane peel

## 2021-12-27 NOTE — Assessment & Plan Note (Signed)
Anatomy continues to improve

## 2021-12-27 NOTE — Patient Instructions (Addendum)
Discontinue ketorolac left eye  Decrease prednisolone acetate white or pink top to once daily left eye  Continue ofloxacin 1 drop left eye only twice daily  Do not mash compress or rub the eye and sleep with a hard patch for the next 2 nights  Patient instructed to sleep with a heart patch for the next 2 nights

## 2021-12-27 NOTE — Assessment & Plan Note (Signed)
No specific medial opacity, no specific corneal opacity no specific posterior capsule opacity, minor posterior capsule opacity is present but not likely to cause this degree of fogging.  We will continue to monitor that.  There is no vascular abnormality seen posteriorly on examination.  OCT is entirely normal with improving macular features post vitrectomy ILM peel and lessening of CME.

## 2021-12-27 NOTE — Assessment & Plan Note (Signed)
OS, resolved post retracted

## 2022-01-02 ENCOUNTER — Ambulatory Visit (INDEPENDENT_AMBULATORY_CARE_PROVIDER_SITE_OTHER): Payer: Medicare HMO | Admitting: Ophthalmology

## 2022-01-02 ENCOUNTER — Encounter (INDEPENDENT_AMBULATORY_CARE_PROVIDER_SITE_OTHER): Payer: Self-pay | Admitting: Ophthalmology

## 2022-01-02 DIAGNOSIS — H179 Unspecified corneal scar and opacity: Secondary | ICD-10-CM | POA: Diagnosis not present

## 2022-01-02 DIAGNOSIS — H35352 Cystoid macular degeneration, left eye: Secondary | ICD-10-CM | POA: Diagnosis not present

## 2022-01-02 DIAGNOSIS — H43812 Vitreous degeneration, left eye: Secondary | ICD-10-CM

## 2022-01-02 DIAGNOSIS — H35372 Puckering of macula, left eye: Secondary | ICD-10-CM

## 2022-01-02 NOTE — Progress Notes (Signed)
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 female who presents to the clinic today for:   HPI   1 week for DILATE OS, OCT. Pt stated, "My left eye is still blurry and its still teary, its swollen. Feels like its cornea. In the morning its great but then I start blinking." Pt denies pain but confirms irritation in the left eye. Pt stated she is still sensitive to light.  Last edited by Silvestre Moment on 01/02/2022 10:13 AM.      Referring physician: Glenis Smoker, MD [place],  [place] [id number]  HISTORICAL INFORMATION:   Selected notes from the MEDICAL RECORD NUMBER       CURRENT MEDICATIONS: Current Outpatient Medications (Ophthalmic Drugs)  Medication Sig   ketorolac (ACULAR) 0.5 % ophthalmic solution Place 1 drop into the left eye 4 (four) times daily for 600 doses.   No current facility-administered medications for this visit. (Ophthalmic Drugs)   Current Outpatient Medications (Other)  Medication Sig   aspirin EC 81 MG tablet Take 81 mg by mouth daily.   BIOTIN 5000 PO Take by mouth.   ezetimibe (ZETIA) 10 MG tablet TAKE 1 TABLET BY MOUTH DAILY   glucosamine-chondroitin 500-400 MG tablet Take 1 tablet by mouth 3 (three) times daily.   hydrochlorothiazide (MICROZIDE) 12.5 MG capsule TAKE ONE CAPSULE BY MOUTH DAILY   naproxen (NAPROSYN) 500 MG tablet Take 1 tablet (500 mg total) by mouth 2 (two) times daily as needed for mild pain, moderate pain or headache (TAKE WITH MEALS.). (Patient not taking: Reported on 02/20/2021)   omeprazole (PRILOSEC) 20 MG capsule 1 capsule 30 minutes before morning meal   rosuvastatin (CRESTOR) 40 MG tablet Take 1 tablet (40 mg total) by mouth daily.   No current facility-administered medications for this visit. (Other)      REVIEW OF  SYSTEMS: ROS   Negative for: Constitutional, Gastrointestinal, Neurological, Skin, Genitourinary, Musculoskeletal, HENT, Endocrine, Cardiovascular, Eyes, Respiratory, Psychiatric, Allergic/Imm, Heme/Lymph Last edited by Silvestre Moment on 01/02/2022 10:02 AM.       ALLERGIES Allergies  Allergen Reactions   Norco [Hydrocodone-Acetaminophen] Nausea And Vomiting   Morphine And Related Other (See Comments)    Patient stated that she can take this but she has to take it with a nausea medication    PAST MEDICAL HISTORY Past Medical History:  Diagnosis Date   Allergy    seasonal    Arthritis    hand    Cancer ([place]) 1998   neck basal cell   Cataract    left side removed    GERD (gastroesophageal reflux disease)    Hyperlipidemia    Osteopenia    Raynaud phenomenon    Past Surgical History:  Procedure Laterality Date   CATARACT EXTRACTION W/ INTRAOCULAR LENS IMPLANT Left 2004   left    COLONOSCOPY     [place], 2013 DB- normal    CYSTECTOMY  2008   benign from neck    [place]   ganglion cyst x 2   POPLITEAL SYNOVIAL CYST EXCISION  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 Patrice Paradise   TUBAL LIGATION  1980  UMBILICAL HERNIA REPAIR  1979    FAMILY HISTORY Family History  Problem Relation Age of Onset   Colon cancer Father    Hyperlipidemia Father    Leukemia Father        due to treatment for colon cancer   Colon polyps Father    Colon cancer Maternal Grandmother    Muscular dystrophy Mother        [employer]   Hypertension Sister    Breast cancer Paternal Aunt    Breast cancer Paternal Uncle    Esophageal cancer Neg Hx    Rectal cancer Neg Hx    Stomach cancer Neg Hx     SOCIAL HISTORY Social History   Tobacco Use   Smoking status: Former   Smokeless tobacco: Never   Tobacco comments:    a couple cigarettes a day  Vaping Use   Vaping Use: Never used  Substance Use Topics    Alcohol use: Yes    Alcohol/week: 2.0 standard drinks of alcohol    Types: 2 Glasses of wine per week   Drug use: No         OPHTHALMIC EXAM:  Base Eye Exam     Visual Acuity (ETDRS)       Right Left   Dist [place] 20/20 -2 20/70   Dist ph [place]  20/30 -2         Tonometry (Tonopen, 10:09 AM)       Right Left   Pressure 14 18         Pupils       Pupils APD   Right PERRL None   Left PERRL None         Visual Fields       Left Right    Full Full         Extraocular Movement       Right Left    Full Full         Neuro/Psych     Oriented x3: Yes   Mood/Affect: Normal         Dilation     Left eye: 2.5% Phenylephrine, 1.0% Mydriacyl @ 10:09 AM           Slit Lamp and Fundus Exam     External Exam       Right Left   External Normal Normal         Slit Lamp Exam       Right Left   Lids/Lashes Normal Normal   Conjunctiva/Sclera White and quiet White and quiet   Cornea Clear no corneal infiltrates, no epi defects, old corneal scar anterior Bowman's layer just nasal to the visual axis OS.   Anterior Chamber Deep and quiet Deep and quiet   Iris Round and reactive Round and reactive, NO TI Defects   Lens Posterior chamber intraocular lens Posterior chamber intraocular lens, 1+ Posterior capsular opacification   Anterior Vitreous Normal Normal         Fundus Exam       Right Left   Posterior Vitreous  Clear, avitric   Disc  Normal   C/D Ratio  0.4   Macula  Less topographic distortion,    Vessels  Normal   Periphery  Normal            IMAGING AND PROCEDURES  Imaging and Procedures for 01/02/22  OCT, Retina - OU - Both Eyes       Right Eye Quality was good. Scan locations  included subfoveal. Central Foveal Thickness: 291. Progression has been stable. Findings include normal foveal contour.   Left Eye Quality was borderline. Central Foveal Thickness: 359. Progression has improved. Findings include abnormal foveal  contour.   Notes Clear media.  Nicely improved OS, no residual CME no residual ERM, with center foveal dip to the ERM line now 185 as compared to 216 last week.  This improvement is good but unexplained as the etiology last week other than then the fact that she awakened last week with blurred vision and with foreign body sensation thus suggesting there may have been some inadvertent compression of the globe or minor trauma to the globe at that time             ASSESSMENT/PLAN:  Left epiretinal membrane Still looks good OS improving macular thickening.  Improved acuity with pinhole acuity now 20/30 as compared to last week  Incidentally thickness from the foveal depression to the ELN layer has improved this week which does suggest possibility of inadvertent compression of globe last week while sleeping and having blurred vision and foreign body sensation upon awakening  Posterior vitreous detachment, left eye OS resolved  Left corneal scar Minor corneal opacity nasal to relax this, with no epithelial breakdown no epithelial infiltrates no stromal infiltrates today  I suggest follow-up with [place] [facility], and evaluate for use of artificial tears preservative-free ongoing and any other therapies that might assist in the foreign body sensation that lingers  She may also need final refraction as the pinhole acuity in the left eye of 20/30 is far better than the uncorrected acuity of 20/70.  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 Left epiretinal membrane  H35.372 OCT, Retina - OU - Both Eyes    2. Posterior vitreous detachment, left eye  H43.812     3. Left corneal scar  H17.9       1.  Artificial tears, preservative-free recommended OS until final evaluation with [place] [facility] care can be undertaken  2.  Patient should consider an refraction and following their directions  and regarding foreign body sensation which persists  3.  Macular condition continues to improve both anatomically in thickness and best acuity pinhole 20/30  Ophthalmic Meds Ordered this visit:  No orders of the defined types were placed in this encounter.      Return in about 4 months (around 05/05/2022) for DILATE OU, OCT.  There are no Patient Instructions on file for this visit.   Explained the diagnoses, plan, and follow up with the patient and they expressed understanding.  Patient expressed understanding of the importance of proper follow up care.   Clent Demark Jabarie Pop M.D. Diseases & Surgery of the Retina and Vitreous [facility] [place] 01/02/22     Abbreviations: M myopia (nearsighted); A astigmatism; H hyperopia (farsighted); P presbyopia; Mrx spectacle prescription;  CTL contact lenses; OD right eye; OS left eye; OU both eyes  XT exotropia; ET esotropia; PEK punctate epithelial keratitis; PEE punctate epithelial erosions; DES dry eye syndrome; MGD meibomian gland dysfunction; ATs artificial tears; PFAT's preservative free artificial tears; [place] nuclear sclerotic cataract; PSC posterior subcapsular cataract; ERM epi-retinal membrane; PVD posterior vitreous detachment; RD retinal detachment; DM diabetes mellitus; DR diabetic retinopathy; NPDR non-proliferative diabetic retinopathy; PDR proliferative diabetic retinopathy; CSME clinically significant macular edema; DME diabetic macular edema; dbh  dot blot hemorrhages; CWS cotton wool spot; POAG primary open angle glaucoma; C/D cup-to-disc ratio; HVF humphrey visual field; GVF goldmann visual field; OCT optical coherence tomography; IOP intraocular pressure; BRVO Branch retinal vein occlusion; CRVO central retinal vein occlusion; CRAO central retinal artery occlusion; BRAO branch retinal artery occlusion; RT retinal tear; SB scleral buckle; PPV pars plana vitrectomy; VH Vitreous hemorrhage; PRP panretinal laser photocoagulation;  IVK intravitreal kenalog; VMT vitreomacular traction; MH Macular hole;  NVD neovascularization of the disc; NVE neovascularization elsewhere; AREDS age related eye disease study; ARMD age related macular degeneration; POAG primary open angle glaucoma; EBMD epithelial/anterior basement membrane dystrophy; ACIOL anterior chamber intraocular lens; IOL intraocular lens; PCIOL posterior chamber intraocular lens; Phaco/IOL phacoemulsification with intraocular lens placement; [place] photorefractive keratectomy; LASIK laser assisted in situ keratomileusis; HTN hypertension; DM diabetes mellitus; COPD chronic obstructive pulmonary disease

## 2022-01-02 NOTE — Assessment & Plan Note (Signed)
Still looks good OS improving macular thickening.  Improved acuity with pinhole acuity now 20/30 as compared to last week  Incidentally thickness from the foveal depression to the ELN layer has improved this week which does suggest possibility of inadvertent compression of globe last week while sleeping and having blurred vision and foreign body sensation upon awakening

## 2022-01-02 NOTE — Assessment & Plan Note (Addendum)
Minor corneal opacity nasal to relax this, with no epithelial breakdown no epithelial infiltrates no stromal infiltrates today  I suggest follow-up with [place] [facility] care, and evaluate for use of artificial tears preservative-free ongoing and any other therapies that might assist in the foreign body sensation that lingers  She may also need final refraction as the pinhole acuity in the left eye of 20/30 is far better than the uncorrected acuity of 20/70.  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

## 2022-01-02 NOTE — Assessment & Plan Note (Signed)
OS resolved

## 2022-01-30 ENCOUNTER — Encounter (INDEPENDENT_AMBULATORY_CARE_PROVIDER_SITE_OTHER): Payer: Medicare HMO | Admitting: Ophthalmology

## 2022-02-12 DIAGNOSIS — H35372 Puckering of macula, left eye: Secondary | ICD-10-CM | POA: Diagnosis not present

## 2022-02-13 DIAGNOSIS — Z0100 Encounter for examination of eyes and vision without abnormal findings: Secondary | ICD-10-CM | POA: Diagnosis not present

## 2022-03-19 ENCOUNTER — Other Ambulatory Visit: Payer: Self-pay | Admitting: General Practice

## 2022-05-07 ENCOUNTER — Encounter (INDEPENDENT_AMBULATORY_CARE_PROVIDER_SITE_OTHER): Payer: Medicare HMO | Admitting: Ophthalmology

## 2022-05-07 ENCOUNTER — Encounter (INDEPENDENT_AMBULATORY_CARE_PROVIDER_SITE_OTHER): Payer: Self-pay

## 2022-05-07 DIAGNOSIS — H43812 Vitreous degeneration, left eye: Secondary | ICD-10-CM | POA: Diagnosis not present

## 2022-05-07 DIAGNOSIS — H04122 Dry eye syndrome of left lacrimal gland: Secondary | ICD-10-CM | POA: Diagnosis not present

## 2022-05-07 DIAGNOSIS — H35352 Cystoid macular degeneration, left eye: Secondary | ICD-10-CM | POA: Diagnosis not present

## 2022-05-13 ENCOUNTER — Other Ambulatory Visit: Payer: Self-pay | Admitting: Internal Medicine

## 2022-05-21 DIAGNOSIS — Z85828 Personal history of other malignant neoplasm of skin: Secondary | ICD-10-CM | POA: Diagnosis not present

## 2022-05-21 DIAGNOSIS — L814 Other melanin hyperpigmentation: Secondary | ICD-10-CM | POA: Diagnosis not present

## 2022-05-21 DIAGNOSIS — Z08 Encounter for follow-up examination after completed treatment for malignant neoplasm: Secondary | ICD-10-CM | POA: Diagnosis not present

## 2022-05-21 DIAGNOSIS — L821 Other seborrheic keratosis: Secondary | ICD-10-CM | POA: Diagnosis not present

## 2022-05-21 DIAGNOSIS — D225 Melanocytic nevi of trunk: Secondary | ICD-10-CM | POA: Diagnosis not present

## 2022-05-21 DIAGNOSIS — L570 Actinic keratosis: Secondary | ICD-10-CM | POA: Diagnosis not present

## 2022-06-11 DIAGNOSIS — H04122 Dry eye syndrome of left lacrimal gland: Secondary | ICD-10-CM | POA: Diagnosis not present

## 2022-06-11 DIAGNOSIS — H35352 Cystoid macular degeneration, left eye: Secondary | ICD-10-CM | POA: Diagnosis not present

## 2022-06-11 DIAGNOSIS — H43812 Vitreous degeneration, left eye: Secondary | ICD-10-CM | POA: Diagnosis not present

## 2022-06-11 DIAGNOSIS — H16142 Punctate keratitis, left eye: Secondary | ICD-10-CM | POA: Diagnosis not present

## 2022-07-02 ENCOUNTER — Encounter: Payer: Self-pay | Admitting: Internal Medicine

## 2022-07-02 DIAGNOSIS — E785 Hyperlipidemia, unspecified: Secondary | ICD-10-CM

## 2022-07-05 DIAGNOSIS — E785 Hyperlipidemia, unspecified: Secondary | ICD-10-CM | POA: Diagnosis not present

## 2022-07-05 LAB — LIPID PANEL
Chol/HDL Ratio: 3.3 ratio (ref 0.0–4.4)
Cholesterol, Total: 131 mg/dL (ref 100–199)
HDL: 40 mg/dL (ref >39)
LDL Chol Calc (NIH): 72 mg/dL (ref 0–99)
Triglycerides: 99 mg/dL (ref 0–149)
VLDL Cholesterol Cal: 19 mg/dL (ref 5–40)

## 2022-07-08 MED ORDER — ROSUVASTATIN CALCIUM 40 MG PO TABS: 40.0000 mg | ORAL_TABLET | Freq: Every day | ORAL | 0 refills | Status: DC

## 2022-07-11 ENCOUNTER — Encounter: Payer: Self-pay | Admitting: Internal Medicine

## 2022-07-11 ENCOUNTER — Ambulatory Visit: Payer: Medicare HMO | Attending: Internal Medicine | Admitting: Internal Medicine

## 2022-07-11 VITALS — BP 128/60 | HR 79 | Ht 62.0 in | Wt 137.8 lb

## 2022-07-11 DIAGNOSIS — M791 Myalgia, unspecified site: Secondary | ICD-10-CM | POA: Diagnosis not present

## 2022-07-11 DIAGNOSIS — T466X5D Adverse effect of antihyperlipidemic and antiarteriosclerotic drugs, subsequent encounter: Secondary | ICD-10-CM | POA: Diagnosis not present

## 2022-07-11 DIAGNOSIS — I77810 Thoracic aortic ectasia: Secondary | ICD-10-CM | POA: Diagnosis not present

## 2022-07-11 DIAGNOSIS — E785 Hyperlipidemia, unspecified: Secondary | ICD-10-CM | POA: Diagnosis not present

## 2022-07-11 DIAGNOSIS — R931 Abnormal findings on diagnostic imaging of heart and coronary circulation: Secondary | ICD-10-CM | POA: Diagnosis not present

## 2022-07-11 DIAGNOSIS — T466X5A Adverse effect of antihyperlipidemic and antiarteriosclerotic drugs, initial encounter: Secondary | ICD-10-CM

## 2022-07-11 MED ORDER — ROSUVASTATIN CALCIUM 40 MG PO TABS: 40.0000 mg | ORAL_TABLET | Freq: Every day | ORAL | 3 refills | Status: DC

## 2022-07-11 NOTE — Progress Notes (Signed)
LIPID CLINIC CONSULT NOTE  Chief Complaint:  Follow-up dyslipidemia  Primary Care Physician: Glenis Smoker, MD  Primary Cardiologist:  Pixie Casino, MD  HPI:  Erin Cruz is a 77 y.o. female who is being seen today for the evaluation of dyslipidemia at the request of Kara Pacer, MD. this is a 77 year old female kindly referred by [name] for evaluation management of dyslipidemia.  Ms. Erin Cruz has a longstanding history of dyslipidemia and family history of dyslipidemia primarily in her sister.  Her father had a history of coronary disease and 5 vessel CABG in his 49s when ultimately died of colon cancer in his 11s.  She has no known coronary disease.  She had previously been on lovastatin 20 mg daily in 2014 prescribed by Dr. [name] however the notes indicated it was discontinued due to muscle aches.  She was reportedly hesitant to try any different medication.  She was then ultimately transition to ezetimibe which she is taken for a number of years.  Her most recent lipid profile showed total cholesterol of 244, triglycerides 89, HDL 48 and LDL of 181.  05/26/2020  Ms. Erin Cruz returns today for follow-up.  Since I last saw her she did undergo coronary calcium scoring.  This demonstrated unfortunately significant coronary calcification with a score of 707, 94th percentile for age and sex matched controls.  She reports she still asymptomatic and remains very physically active, exercising regularly.  She has had a very good response however to rosuvastatin 40 mg daily.  She is tolerating this without significant side effects.  Total cholesterol has declined from 215 down to 153 with triglycerides 80, HDL 40 and LDL of 98.  While this is an excellent response, I think even more aggressive therapy would be necessary to target her LDL less than 70 given the high risk nature of her calcification and family history of heart disease.  Of note, she was previously on  ezetimibe however when she changed providers this fell off her medication list and was not renewed but she did tolerate it.  08/31/2020  Mrs. Erin Cruz is seen today in follow-up.  Overall she continues to do well with the addition of ezetimibe.  Her cholesterol is significantly lower.  Total is now 132, triglycerides 71, HDL 45 and LDL of 73 down from 98 at her previous lab work in November.  She seems to be tolerating this well.  She did have a recent trip back to [place].  She said she was going up and down stairs a lot and when she came back had problems with her knees.  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  She wondered if it could be the statins however I am less inclined to think that is the case.  She denies any chest pain.  Although she had a high calcium score, she is asymptomatic and I think there is little role for stress testing in the situation.  We discussed the data from the ISCHEMIA trial which is guiding this.  02/20/2021  Mrs. Erin Cruz returns today for follow-up.  Recently she is seen Coletta Memos, NP several times for shortness of breath.  She had an echo which showed mild to moderate aortic insufficiency, moderate TR, and dilated aortic root at 38 mm and normal systolic function.  Blood pressure was a little elevated.  She has been on 12.5 mg hydrochlorothiazide.  She seems to be tolerating this well with good blood pressure control.  She notes  her shortness of breath has resolved.  Cholesterol was well controlled back in March however she is due for repeat lipids.  07/11/2022  Mrs. Erin Cruz is seen today in follow-up.  Overall she is doing well.  She had a repeat echo this summer which showed normal LVEF, mild diastolic dysfunction and mild aortic insufficiency.  Overall she is asymptomatic with this.  It seems to be stable.  Her lipids are also very stable and well-controlled.  Total cholesterol 131, triglycerides 99, HDL 40 and LDL 72.  She is in  need of refills of rosuvastatin.  PMHx:  Past Medical History:  Diagnosis Date   Allergy    seasonal    Arthritis    hand    Cancer ([place]) 1998   neck basal cell   Cataract    left side removed    GERD (gastroesophageal reflux disease)    Hyperlipidemia    Osteopenia    Raynaud phenomenon     Past Surgical History:  Procedure Laterality Date   CATARACT EXTRACTION W/ INTRAOCULAR LENS IMPLANT Left 2004   left    COLONOSCOPY     [place], 2013 [place]- normal    CYSTECTOMY  2008   benign from neck    [place]   ganglion cyst x 2   POPLITEAL SYNOVIAL CYST EXCISION  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 Patrice Paradise   TUBAL LIGATION  4332   UMBILICAL HERNIA REPAIR  1979    FAMHx:  Family History  Problem Relation Age of Onset   Colon cancer Father    Hyperlipidemia Father    Leukemia Father        due to treatment for colon cancer   Colon polyps Father    Colon cancer Maternal Grandmother    Muscular dystrophy Mother        [employer]   Hypertension Sister    Breast cancer Paternal Aunt    Breast cancer Paternal Uncle    Esophageal cancer Neg Hx    Rectal cancer Neg Hx    Stomach cancer Neg Hx     SOCHx:   reports that she has quit smoking. She has never used smokeless tobacco. She reports current alcohol use of about 2.0 standard drinks of alcohol per week. She reports that she does not use drugs.  ALLERGIES:  Allergies  Allergen Reactions   Norco [Hydrocodone-Acetaminophen] Nausea And Vomiting   Morphine And Related Other (See Comments)    Patient stated that she can take this but she has to take it with a nausea medication    ROS: Pertinent items noted in HPI and remainder of comprehensive ROS otherwise negative.  HOME MEDS: Current Outpatient Medications on File Prior to Visit  Medication Sig Dispense Refill   aspirin EC 81 MG tablet Take 81 mg by mouth daily.      ezetimibe (ZETIA) 10 MG tablet TAKE ONE TABLET BY MOUTH DAILY 90 tablet 3   glucosamine-chondroitin 500-400 MG tablet Take 1 tablet by mouth 3 (three) times daily.     hydrochlorothiazide (MICROZIDE) 12.5 MG capsule TAKE ONE CAPSULE BY MOUTH DAILY 90 capsule 3   naproxen (NAPROSYN) 500 MG tablet Take 1 tablet (500 mg total) by mouth 2 (two) times daily as needed for mild pain, moderate pain or headache (TAKE WITH MEALS.). 20 tablet 0   omeprazole (PRILOSEC)  20 MG capsule 1 capsule 30 minutes before morning meal     rosuvastatin (CRESTOR) 40 MG tablet Take 1 tablet (40 mg total) by mouth daily. 30 tablet 0   No current facility-administered medications on file prior to visit.    LABS/IMAGING: No results found for this or any previous visit (from the past 48 hour(s)). No results found.  LIPID PANEL:    Component Value Date/Time   CHOL 131 07/05/2022 0845   TRIG 99 07/05/2022 0845   HDL 40 07/05/2022 0845   CHOLHDL 3.3 07/05/2022 0845   CHOLHDL 6.3 (H) 03/19/2016 0933   VLDL 21 03/19/2016 0933   LDLCALC 72 07/05/2022 0845    WEIGHTS: Wt Readings from Last 3 Encounters:  07/11/22 137 lb 12.8 oz (62.5 kg)  02/20/21 128 lb (58.1 kg)  12/08/20 129 lb 9.6 oz (58.8 kg)    VITALS: BP 128/60   Pulse 79   Ht '5\' 2"'$  (1.575 m)   Wt 137 lb 12.8 oz (62.5 kg)   SpO2 98%   BMI 25.20 kg/m   EXAM: Deferred  EKG: Deferred  ASSESSMENT: Mixed dyslipidemia, goal LDL <70 CAC score of 707, 94th percentile Family history of coronary disease Myalgia on lovastatin Mild aortic insufficiency  PLAN: 1.  Ms. Dileo very well with stable mild aortic insufficiency on echo.  She is asymptomatic.  She has no chest pain despite a high calcium score.  Her cholesterol is essentially at target with LDL of 72.  She has done well on combination rosuvastatin and ezetimibe.  Plan follow-up with me annually or sooner as necessary.  Pixie Casino, MD, [facility] [place]  Director of the Advanced Lipid Disorders &  Cardiovascular Risk Reduction Clinic Diplomate of the American Board of Clinical Lipidology Attending Cardiologist  Direct Dial: [phone]  Fax: [phone]  Website:  [email or web address] 07/11/2022, 8:19 AM

## 2022-07-11 NOTE — Patient Instructions (Signed)
Medication Instructions:  Your physician recommends that you continue on your current medications as directed. Please refer to the Current Medication list given to you today.  *If you need a refill on your cardiac medications before your next appointment, please call your pharmacy*   Follow-Up: At [facility], you and your health needs are our priority.  As part of our continuing mission to provide you with exceptional heart care, we have created designated Provider Care Teams.  These Care Teams include your primary Cardiologist (physician) and Advanced Practice Providers (APPs -  Physician Assistants and Nurse Practitioners) who all work together to provide you with the care you need, when you need it.  We recommend signing up for the patient portal called "MyChart".  Sign up information is provided on this After Visit Summary.  MyChart is used to connect with patients for Virtual Visits (Telemedicine).  Patients are able to view lab/test results, encounter notes, upcoming appointments, etc.  Non-urgent messages can be sent to your provider as well.   To learn more about what you can do with MyChart, go to [email or web address].    Your next appointment:    12 months with Dr. Hilty 

## 2022-08-13 DIAGNOSIS — H43812 Vitreous degeneration, left eye: Secondary | ICD-10-CM | POA: Diagnosis not present

## 2022-08-13 DIAGNOSIS — H538 Other visual disturbances: Secondary | ICD-10-CM | POA: Diagnosis not present

## 2022-08-13 DIAGNOSIS — H16142 Punctate keratitis, left eye: Secondary | ICD-10-CM | POA: Diagnosis not present

## 2022-08-13 DIAGNOSIS — H04122 Dry eye syndrome of left lacrimal gland: Secondary | ICD-10-CM | POA: Diagnosis not present

## 2022-08-13 DIAGNOSIS — H35352 Cystoid macular degeneration, left eye: Secondary | ICD-10-CM | POA: Diagnosis not present

## 2022-08-28 DIAGNOSIS — I10 Essential (primary) hypertension: Secondary | ICD-10-CM | POA: Diagnosis not present

## 2022-08-28 DIAGNOSIS — Z6823 Body mass index (BMI) 23.0-23.9, adult: Secondary | ICD-10-CM | POA: Diagnosis not present

## 2022-08-28 DIAGNOSIS — R110 Nausea: Secondary | ICD-10-CM | POA: Diagnosis not present

## 2022-08-28 DIAGNOSIS — N3001 Acute cystitis with hematuria: Secondary | ICD-10-CM | POA: Diagnosis not present

## 2022-08-28 DIAGNOSIS — R350 Frequency of micturition: Secondary | ICD-10-CM | POA: Diagnosis not present

## 2022-09-02 DIAGNOSIS — M170 Bilateral primary osteoarthritis of knee: Secondary | ICD-10-CM | POA: Diagnosis not present

## 2022-09-02 DIAGNOSIS — M25562 Pain in left knee: Secondary | ICD-10-CM | POA: Diagnosis not present

## 2022-09-02 DIAGNOSIS — G8929 Other chronic pain: Secondary | ICD-10-CM | POA: Diagnosis not present

## 2022-09-02 DIAGNOSIS — M25561 Pain in right knee: Secondary | ICD-10-CM | POA: Diagnosis not present

## 2022-09-03 ENCOUNTER — Other Ambulatory Visit: Payer: Self-pay

## 2022-09-03 MED ORDER — HYDROCHLOROTHIAZIDE 12.5 MG PO CAPS: 12.5000 mg | ORAL_CAPSULE | Freq: Every day | ORAL | 3 refills | Status: DC

## 2022-10-08 DIAGNOSIS — H04123 Dry eye syndrome of bilateral lacrimal glands: Secondary | ICD-10-CM | POA: Diagnosis not present

## 2022-10-21 DIAGNOSIS — M170 Bilateral primary osteoarthritis of knee: Secondary | ICD-10-CM | POA: Diagnosis not present

## 2022-11-13 ENCOUNTER — Other Ambulatory Visit: Payer: Self-pay | Admitting: Family Medicine

## 2022-11-13 DIAGNOSIS — Z0000 Encounter for general adult medical examination without abnormal findings: Secondary | ICD-10-CM

## 2022-11-14 DIAGNOSIS — I77810 Thoracic aortic ectasia: Secondary | ICD-10-CM | POA: Diagnosis not present

## 2022-11-14 DIAGNOSIS — Z0000 Encounter for general adult medical examination without abnormal findings: Secondary | ICD-10-CM | POA: Diagnosis not present

## 2022-11-14 DIAGNOSIS — R931 Abnormal findings on diagnostic imaging of heart and coronary circulation: Secondary | ICD-10-CM | POA: Diagnosis not present

## 2022-11-14 DIAGNOSIS — N1831 Chronic kidney disease, stage 3a: Secondary | ICD-10-CM | POA: Diagnosis not present

## 2022-11-14 DIAGNOSIS — K219 Gastro-esophageal reflux disease without esophagitis: Secondary | ICD-10-CM | POA: Diagnosis not present

## 2022-11-14 DIAGNOSIS — M8589 Other specified disorders of bone density and structure, multiple sites: Secondary | ICD-10-CM | POA: Diagnosis not present

## 2022-11-14 DIAGNOSIS — Z1211 Encounter for screening for malignant neoplasm of colon: Secondary | ICD-10-CM | POA: Diagnosis not present

## 2022-11-14 DIAGNOSIS — R030 Elevated blood-pressure reading, without diagnosis of hypertension: Secondary | ICD-10-CM | POA: Diagnosis not present

## 2022-11-14 DIAGNOSIS — E7800 Pure hypercholesterolemia, unspecified: Secondary | ICD-10-CM | POA: Diagnosis not present

## 2022-12-03 ENCOUNTER — Ambulatory Visit
Admission: RE | Admit: 2022-12-03 | Discharge: 2022-12-03 | Disposition: A | Discharge status: Home / Self Care | Payer: Medicare HMO | Source: Ambulatory Visit | Attending: Family Medicine | Admitting: Family Medicine

## 2022-12-03 DIAGNOSIS — Z1231 Encounter for screening mammogram for malignant neoplasm of breast: Secondary | ICD-10-CM | POA: Diagnosis not present

## 2022-12-03 DIAGNOSIS — Z0000 Encounter for general adult medical examination without abnormal findings: Secondary | ICD-10-CM

## 2022-12-05 DIAGNOSIS — Z1211 Encounter for screening for malignant neoplasm of colon: Secondary | ICD-10-CM | POA: Diagnosis not present

## 2022-12-11 DIAGNOSIS — H179 Unspecified corneal scar and opacity: Secondary | ICD-10-CM | POA: Diagnosis not present

## 2022-12-11 DIAGNOSIS — H538 Other visual disturbances: Secondary | ICD-10-CM | POA: Diagnosis not present

## 2022-12-11 DIAGNOSIS — H04122 Dry eye syndrome of left lacrimal gland: Secondary | ICD-10-CM | POA: Diagnosis not present

## 2022-12-11 DIAGNOSIS — H35352 Cystoid macular degeneration, left eye: Secondary | ICD-10-CM | POA: Diagnosis not present

## 2022-12-23 DIAGNOSIS — M25571 Pain in right ankle and joints of right foot: Secondary | ICD-10-CM | POA: Diagnosis not present

## 2022-12-23 DIAGNOSIS — M25572 Pain in left ankle and joints of left foot: Secondary | ICD-10-CM | POA: Diagnosis not present

## 2022-12-23 DIAGNOSIS — M25512 Pain in left shoulder: Secondary | ICD-10-CM | POA: Diagnosis not present

## 2022-12-23 DIAGNOSIS — M25541 Pain in joints of right hand: Secondary | ICD-10-CM | POA: Diagnosis not present

## 2022-12-23 DIAGNOSIS — M25511 Pain in right shoulder: Secondary | ICD-10-CM | POA: Diagnosis not present

## 2022-12-23 DIAGNOSIS — M25562 Pain in left knee: Secondary | ICD-10-CM | POA: Diagnosis not present

## 2022-12-23 DIAGNOSIS — M25561 Pain in right knee: Secondary | ICD-10-CM | POA: Diagnosis not present

## 2022-12-23 DIAGNOSIS — M25542 Pain in joints of left hand: Secondary | ICD-10-CM | POA: Diagnosis not present

## 2023-02-03 DIAGNOSIS — R946 Abnormal results of thyroid function studies: Secondary | ICD-10-CM | POA: Diagnosis not present

## 2023-02-20 DIAGNOSIS — H35372 Puckering of macula, left eye: Secondary | ICD-10-CM | POA: Diagnosis not present

## 2023-03-06 DIAGNOSIS — M25571 Pain in right ankle and joints of right foot: Secondary | ICD-10-CM | POA: Diagnosis not present

## 2023-04-18 DIAGNOSIS — M19072 Primary osteoarthritis, left ankle and foot: Secondary | ICD-10-CM | POA: Diagnosis not present

## 2023-04-18 DIAGNOSIS — M19071 Primary osteoarthritis, right ankle and foot: Secondary | ICD-10-CM | POA: Diagnosis not present

## 2023-04-18 DIAGNOSIS — M792 Neuralgia and neuritis, unspecified: Secondary | ICD-10-CM | POA: Diagnosis not present

## 2023-04-18 DIAGNOSIS — S86311A Strain of muscle(s) and tendon(s) of peroneal muscle group at lower leg level, right leg, initial encounter: Secondary | ICD-10-CM | POA: Diagnosis not present

## 2023-05-12 ENCOUNTER — Other Ambulatory Visit: Payer: Self-pay | Admitting: *Deleted

## 2023-05-12 DIAGNOSIS — E785 Hyperlipidemia, unspecified: Secondary | ICD-10-CM

## 2023-05-13 NOTE — Progress Notes (Signed)
Office Visit Note  Patient: Erin Cruz             Date of Birth: January 06, 1946           MRN: [id number]             PCP: Shon Hale, MD Referring: Raquel James, PA-C Visit Date: 05/27/2023 Occupation: @GUAROCC @  Subjective:  Pain in multiple joints  History of Present Illness: Erin Cruz is a 77 y.o. female seen in consultation per request of her PCP.  According the patient she has had history of joint pain for many years.  She had disc disease of her lumbar spine with a spinal stenosis and she underwent discectomy and lumbar spine fusion in 2016.  She states she has not had much discomfort in her back since then.  She has had neck discomfort in the past which resolved after she retired.  She has some stiffness and discomfort in her hands when she is doing gardening.  She has not noticed any joint swelling.  She has had left shoulder pain for the last few years.  She has seen Dr. Valentina Gu in the past who did x-rays and gave her an injection which helped temporarily.  She states her shoulder is doing quite well except when she is exercising.  She has been experiencing pain and discomfort in her left knee for the last 5 years.  She has been under care of Dr. Valentina Gu who told her that she had osteoarthritis in her both knees.  She states she has intermittent flares of her knee joint pain especially her left knee which gets better by using ice and Voltaren gel.  She has also had discomfort in her feet for which she is seen a podiatrist.  She has been told that she had dorsal spur on her left foot which was injected about a week ago.  She was also given a Medrol Dosepak in July which helped her symptoms remarkably.  She does not see any obvious swelling in her joints but her joints are painful.  She was also given a prescription for Celebrex by her orthopedic surgeon which she did not take because she has poor renal function.  She states she is not able to do her routine walking and  aerobic exercises due to discomfort.  She gives history of Raynauds in her feet.  There is no family history of autoimmune disease.  She is gravida 2, para 2.  No history of DVTs.    Activities of Daily Living:  Patient reports morning stiffness for less than 5 minutes.   Patient Reports nocturnal pain.  Difficulty dressing/grooming: Denies Difficulty climbing stairs: Denies Difficulty getting out of chair: Denies Difficulty using hands for taps, buttons, cutlery, and/or writing: Reports  Review of Systems  Constitutional:  Negative for fatigue.  HENT:  Positive for mouth dryness. Negative for mouth sores.   Eyes:  Positive for dryness.  Respiratory:  Negative for shortness of breath.   Cardiovascular:  Negative for chest pain and palpitations.  Gastrointestinal:  Negative for blood in stool, constipation and diarrhea.  Endocrine: Negative for increased urination.  Genitourinary:  Negative for involuntary urination.  Musculoskeletal:  Positive for joint pain, joint pain, myalgias, morning stiffness and myalgias. Negative for joint swelling, muscle weakness and muscle tenderness.  Skin:  Positive for color change. Negative for rash, hair loss and sensitivity to sunlight.  Allergic/Immunologic: Negative for susceptible to infections.  Neurological:  Negative for dizziness and headaches.  Hematological:  Negative for swollen glands.  Psychiatric/Behavioral:  Negative for depressed mood and sleep disturbance. The patient is not nervous/anxious.     PMFS History:  Patient Active Problem List   Diagnosis Date Noted   Left corneal scar 01/02/2022   Blurred vision, left eye 12/27/2021   Posterior vitreous detachment, left eye 11/29/2021   Left epiretinal membrane 07/04/2021   Pseudophakia, left eye 05/02/2021   Cystoid macular edema of left eye 05/02/2021   BMI 25.0-25.9,adult 04/08/2017   History of spinal stenosis 09/17/2016   Deafness in right ear 03/19/2016   Osteopenia    GERD  (gastroesophageal reflux disease)    DDD (degenerative disc disease), lumbar 10/05/2013   Pure hypercholesterolemia 04/08/2013   Basal cell carcinoma (BCC) of neck 06/24/1996    Past Medical History:  Diagnosis Date   Allergy    seasonal    Arthritis    hand    Cancer (HCC) 1998   neck basal cell   Cataract    left side removed    GERD (gastroesophageal reflux disease)    Hyperlipidemia    Osteopenia    Raynaud phenomenon     Family History  Problem Relation Age of Onset   Muscular dystrophy Mother        IBM   Congestive Heart Failure Mother    Colon cancer Father    Hyperlipidemia Father    Leukemia Father        due to treatment for colon cancer   Colon polyps Father    Osteoarthritis Father    Hypertension Sister    Healthy Sister    Breast cancer Paternal Aunt    Breast cancer Paternal Uncle    Colon cancer Maternal Grandmother    Rheum arthritis Paternal Grandmother    Healthy Daughter    Healthy Son    Rheum arthritis Paternal Great-grandmother    Esophageal cancer Neg Hx    Rectal cancer Neg Hx    Stomach cancer Neg Hx    Past Surgical History:  Procedure Laterality Date   CATARACT EXTRACTION Right    CATARACT EXTRACTION W/ INTRAOCULAR LENS IMPLANT Left 2004   left    COLONOSCOPY     [place], 2013 DB- normal    CYSTECTOMY  2008   benign from neck    EYE SURGERY Left    HAND SURGERY  1965 & 1970   ganglion cyst x 2   POPLITEAL SYNOVIAL CYST EXCISION  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 Noel Gerold   TUBAL LIGATION  1980   UMBILICAL HERNIA REPAIR  1979   Social History   Social History Narrative   Exercise: Does exercise 7 days a week, classes 6 days a week and walking one day a week.   Diet: Loves vegetables, fruits, and quinoa. Chicken and fish. Yogurt, cheese, and almond milk. Drinks water.    Lives alone.   Daughter lives in [place].   Son lives in [place].    Immunization History  Administered Date(s) Administered   Influenza Split 03/11/2012, 04/01/2015   Influenza, High Dose Seasonal PF 03/17/2018   Influenza,inj,Quad PF,6+ Mos 04/08/2013, 03/19/2016, 04/08/2017   Influenza-Unspecified 03/13/2018   PFIZER(Purple Top)SARS-COV-2 Vaccination 07/31/2019, 08/25/2019   Pneumococcal Conjugate-13 07/23/2013   Pneumococcal Polysaccharide-23 04/24/2013, 12/20/2015   Tdap 04/20/2014   Zoster, Live 03/24/2012     Objective: Vital Signs: BP (!) 146/73 (BP Location: Right Arm, Patient Position:  Sitting, Cuff Size: Normal)   Pulse 70   Resp 14   Ht 5' 0.5" (1.537 m)   Wt 128 lb 12.8 oz (58.4 kg)   BMI 24.74 kg/m    Physical Exam Vitals and nursing note reviewed.  Constitutional:      Appearance: She is well-developed.  HENT:     Head: Normocephalic and atraumatic.  Eyes:     Conjunctiva/sclera: Conjunctivae normal.  Cardiovascular:     Rate and Rhythm: Normal rate and regular rhythm.     Heart sounds: Normal heart sounds.  Pulmonary:     Effort: Pulmonary effort is normal.     Breath sounds: Normal breath sounds.  Abdominal:     General: Bowel sounds are normal.     Palpations: Abdomen is soft.  Musculoskeletal:     Cervical back: Normal range of motion.  Lymphadenopathy:     Cervical: No cervical adenopathy.  Skin:    General: Skin is warm and dry.     Capillary Refill: Capillary refill takes less than 2 seconds.  Neurological:     Mental Status: She is alert and oriented to person, place, and time.  Psychiatric:        Behavior: Behavior normal.      Musculoskeletal Exam: Cervical, thoracic and lumbar spine were in good range of motion.  Patient was able to reach the floor with the palm of her hands.  Shoulder joints, elbow joints, wrist joints with good range of motion.  She had bilateral CMC PIP and DIP thickening with no synovitis.  Hip joints and knee joints with good range of motion without any warmth swelling or  effusion.  There was no tenderness over ankles.  She had bunionectomy scar on her left first MTP and scar on her left second digit from fusion.  Bilateral dorsal spurs more prominent on the left foot was noted.  Bilateral pes cavus was noted.  CDAI Exam: CDAI Score: -- Patient Global: --; Provider Global: -- Swollen: --; Tender: -- Joint Exam 05/27/2023   No joint exam has been documented for this visit   There is currently no information documented on the homunculus. Go to the Rheumatology activity and complete the homunculus joint exam.  Investigation: No additional findings.  Imaging: No results found.  Recent Labs: Lab Results  Component Value Date   WBC 5.5 04/14/2018   HGB 13.9 04/14/2018   PLT 255 04/14/2018   NA 141 12/27/2020   K 4.2 12/27/2020   CL 101 12/27/2020   CO2 25 12/27/2020   GLUCOSE 85 12/27/2020   BUN 11 12/27/2020   CREATININE 0.88 12/27/2020   BILITOT 0.3 04/14/2018   ALKPHOS 77 04/14/2018   AST 20 04/14/2018   ALT 12 04/14/2018   PROT 6.7 04/14/2018   ALBUMIN 4.3 04/14/2018   CALCIUM 9.7 12/27/2020   GFRAA 66 04/14/2018    Speciality Comments: No specialty comments available.  Procedures:  No procedures performed Allergies: Norco [hydrocodone-acetaminophen] and Morphine and codeine   Assessment / Plan:     Visit Diagnoses: Polyarthralgia -patient complains of pain and discomfort in multiple joints.  Shoulders, knees, feet. celebrex-d/c due to renal function. medrol dosepak prescribed by ortho in July 2024.  Patient states that she had very good response to the Medrol Dosepak but the pain gradually returned.  Chronic left shoulder pain-she complains of discomfort in the left shoulder.  She states the discomfort only happens when she is lifting weights overhead.  She describes discomfort in the anterior  aspect of her shoulder.  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  She was under care of Dr. Valentina Gu and had cortisone injection  previously.  I encouraged her to do those exercises.  Pain in both hands-she complains of pain and discomfort in the bilateral hands.  No synovitis was noted.  Clinical findings were consistent with osteoarthritis.  She had bilateral CMC PIP and DIP thickening more prominent in the left hand.  She is left-handed.  X-rays obtained of bilateral hands today showed osteoarthritic changes.  Generalized osteopenia was noted.  No chondrocalcinosis was noted.  Primary osteoarthritis of both knees -she complains of intermittent increased pain in her knee joints especially when she is exercising.  She states she is not able to enjoy aerobic exercises and walking due to intermittent knee joint discomfort.  X-rays of bilateral knee joint showed moderate to severe osteoarthritis and moderate chondromalacia patella.  No chondrocalcinosis was noted.  Primary osteoarthritis of both feet -she has ongoing pain and discomfort in her feet.  Patient states she had recent x-rays and she was told that she has osteoarthritis.  She had pes cavus bilaterally.  Orthotics were advised.  She had left bunionectomy and second toe fusion per patient.  Patient states she had left foot dorsal spur cortisone injection last week.  Degeneration of intervertebral disc of lumbar region without discogenic back pain or lower extremity pain  History of spinal stenosis - S/P discectomy and fusion in 2016 by Dr. Noel Gerold.  She had good response to the surgery.  She states she has good mobility in her spine.  She was able to reach the floor with the palm of her hands today.  History of osteopenia-DEXA scan was reviewed from April 09, 2021 which showed T-score of -2.4.  Patient states her bone density has been stable.  Use of calcium, vitamin D and resistive exercises were discussed.  Other medical problems are listed as follows:  History of gastroesophageal reflux (GERD)  Pure hypercholesterolemia  Basal cell carcinoma (BCC) of  neck  Cystoid macular edema of left eye  Left corneal scar  Posterior vitreous detachment, left eye  Pseudophakia, left eye  Deafness in right ear  Orders: No orders of the defined types were placed in this encounter.  No orders of the defined types were placed in this encounter.   Follow-Up Instructions: Return for Polyarthralgia.   Pollyann Savoy, MD  Note - This record has been created using Animal nutritionist.  Chart creation errors have been sought, but may not always  have been located. Such creation errors do not reflect on  the standard of medical care.

## 2023-05-16 ENCOUNTER — Other Ambulatory Visit: Payer: Self-pay | Admitting: Internal Medicine

## 2023-05-21 DIAGNOSIS — M779 Enthesopathy, unspecified: Secondary | ICD-10-CM | POA: Diagnosis not present

## 2023-05-21 DIAGNOSIS — M792 Neuralgia and neuritis, unspecified: Secondary | ICD-10-CM | POA: Diagnosis not present

## 2023-05-21 DIAGNOSIS — M19072 Primary osteoarthritis, left ankle and foot: Secondary | ICD-10-CM | POA: Diagnosis not present

## 2023-05-27 ENCOUNTER — Ambulatory Visit: Payer: Medicare HMO

## 2023-05-27 ENCOUNTER — Ambulatory Visit (INDEPENDENT_AMBULATORY_CARE_PROVIDER_SITE_OTHER): Payer: Medicare HMO

## 2023-05-27 ENCOUNTER — Encounter: Payer: Self-pay | Admitting: Rheumatology

## 2023-05-27 ENCOUNTER — Ambulatory Visit: Payer: Medicare HMO | Attending: Rheumatology | Admitting: Rheumatology

## 2023-05-27 VITALS — BP 146/73 | HR 70 | Resp 14 | Ht 60.5 in | Wt 128.8 lb

## 2023-05-27 DIAGNOSIS — R5383 Other fatigue: Secondary | ICD-10-CM

## 2023-05-27 DIAGNOSIS — E7800 Pure hypercholesterolemia, unspecified: Secondary | ICD-10-CM

## 2023-05-27 DIAGNOSIS — M170 Bilateral primary osteoarthritis of knee: Secondary | ICD-10-CM

## 2023-05-27 DIAGNOSIS — M19071 Primary osteoarthritis, right ankle and foot: Secondary | ICD-10-CM | POA: Diagnosis not present

## 2023-05-27 DIAGNOSIS — M25512 Pain in left shoulder: Secondary | ICD-10-CM | POA: Diagnosis not present

## 2023-05-27 DIAGNOSIS — M79641 Pain in right hand: Secondary | ICD-10-CM | POA: Diagnosis not present

## 2023-05-27 DIAGNOSIS — Z961 Presence of intraocular lens: Secondary | ICD-10-CM

## 2023-05-27 DIAGNOSIS — M79642 Pain in left hand: Secondary | ICD-10-CM | POA: Diagnosis not present

## 2023-05-27 DIAGNOSIS — H9191 Unspecified hearing loss, right ear: Secondary | ICD-10-CM

## 2023-05-27 DIAGNOSIS — Z8719 Personal history of other diseases of the digestive system: Secondary | ICD-10-CM | POA: Diagnosis not present

## 2023-05-27 DIAGNOSIS — H35352 Cystoid macular degeneration, left eye: Secondary | ICD-10-CM

## 2023-05-27 DIAGNOSIS — C4441 Basal cell carcinoma of skin of scalp and neck: Secondary | ICD-10-CM | POA: Diagnosis not present

## 2023-05-27 DIAGNOSIS — M19072 Primary osteoarthritis, left ankle and foot: Secondary | ICD-10-CM

## 2023-05-27 DIAGNOSIS — M25561 Pain in right knee: Secondary | ICD-10-CM

## 2023-05-27 DIAGNOSIS — M25562 Pain in left knee: Secondary | ICD-10-CM

## 2023-05-27 DIAGNOSIS — I7300 Raynaud's syndrome without gangrene: Secondary | ICD-10-CM

## 2023-05-27 DIAGNOSIS — Z8739 Personal history of other diseases of the musculoskeletal system and connective tissue: Secondary | ICD-10-CM

## 2023-05-27 DIAGNOSIS — M2550 Pain in unspecified joint: Secondary | ICD-10-CM

## 2023-05-27 DIAGNOSIS — G8929 Other chronic pain: Secondary | ICD-10-CM

## 2023-05-27 DIAGNOSIS — M51369 Other intervertebral disc degeneration, lumbar region without mention of lumbar back pain or lower extremity pain: Secondary | ICD-10-CM | POA: Diagnosis not present

## 2023-05-27 DIAGNOSIS — H43812 Vitreous degeneration, left eye: Secondary | ICD-10-CM

## 2023-05-27 DIAGNOSIS — H179 Unspecified corneal scar and opacity: Secondary | ICD-10-CM

## 2023-06-02 LAB — CBC WITH DIFFERENTIAL/PLATELET
Absolute Lymphocytes: 2030 {cells}/uL (ref 850–3900)
Absolute Monocytes: 670 {cells}/uL (ref 200–950)
Basophils Absolute: 40 {cells}/uL (ref 0–200)
Basophils Relative: 0.6 %
Eosinophils Absolute: 221 {cells}/uL (ref 15–500)
Eosinophils Relative: 3.3 %
HCT: 44.3 % (ref 35.0–45.0)
Hemoglobin: 14 g/dL (ref 11.7–15.5)
MCH: 28.9 pg (ref 27.0–33.0)
MCHC: 31.6 g/dL — ABNORMAL LOW (ref 32.0–36.0)
MCV: 91.5 fL (ref 80.0–100.0)
MPV: 9.3 fL (ref 7.5–12.5)
Monocytes Relative: 10 %
Neutro Abs: 3739 {cells}/uL (ref 1500–7800)
Neutrophils Relative %: 55.8 %
Platelets: 275 10*3/uL (ref 140–400)
RBC: 4.84 10*6/uL (ref 3.80–5.10)
RDW: 12.9 % (ref 11.0–15.0)
Total Lymphocyte: 30.3 %
WBC: 6.7 10*3/uL (ref 3.8–10.8)

## 2023-06-02 LAB — COMPLETE METABOLIC PANEL WITH GFR
AG Ratio: 1.7 (calc) (ref 1.0–2.5)
ALT: 18 U/L (ref 6–29)
AST: 23 U/L (ref 10–35)
Albumin: 4.6 g/dL (ref 3.6–5.1)
Alkaline phosphatase (APISO): 64 U/L (ref 37–153)
BUN/Creatinine Ratio: 11 (calc) (ref 6–22)
BUN: 12 mg/dL (ref 7–25)
CO2: 34 mmol/L — ABNORMAL HIGH (ref 20–32)
Calcium: 9.7 mg/dL (ref 8.6–10.4)
Chloride: 100 mmol/L (ref 98–110)
Creat: 1.09 mg/dL — ABNORMAL HIGH (ref 0.60–1.00)
Globulin: 2.7 g/dL (ref 1.9–3.7)
Glucose, Bld: 85 mg/dL (ref 65–99)
Potassium: 4 mmol/L (ref 3.5–5.3)
Sodium: 139 mmol/L (ref 135–146)
Total Bilirubin: 0.6 mg/dL (ref 0.2–1.2)
Total Protein: 7.3 g/dL (ref 6.1–8.1)
eGFR: 53 mL/min/{1.73_m2} — ABNORMAL LOW (ref >=60)

## 2023-06-02 LAB — ANTI-SMITH ANTIBODY: ENA SM Ab Ser-aCnc: <1 AI

## 2023-06-02 LAB — C3 AND C4
C3 Complement: 141 mg/dL (ref 83–193)
C4 Complement: 18 mg/dL (ref 15–57)

## 2023-06-02 LAB — SJOGRENS SYNDROME-A EXTRACTABLE NUCLEAR ANTIBODY: SSA (Ro) (ENA) Antibody, IgG: <1 AI

## 2023-06-02 LAB — BETA-2 GLYCOPROTEIN ANTIBODIES
Beta-2 Glyco 1 IgA: <2 U/mL
Beta-2 Glyco 1 IgM: 11.8 U/mL
Beta-2 Glyco I IgG: <2 U/mL

## 2023-06-02 LAB — ANTI-DNA ANTIBODY, DOUBLE-STRANDED: ds DNA Ab: <1 [IU]/mL

## 2023-06-02 LAB — CARDIOLIPIN ANTIBODIES, IGG, IGM, IGA
Anticardiolipin IgA: <2 [APL'U]/mL (ref <20.0)
Anticardiolipin IgG: <2 [GPL'U]/mL (ref <20.0)
Anticardiolipin IgM: 10.8 [MPL'U]/mL (ref <20.0)

## 2023-06-02 LAB — SEDIMENTATION RATE: Sed Rate: 9 mm/h (ref 0–30)

## 2023-06-02 LAB — RHEUMATOID FACTOR: Rheumatoid fact SerPl-aCnc: 15 [IU]/mL — ABNORMAL HIGH

## 2023-06-02 LAB — RNP ANTIBODY: Ribonucleic Protein(ENA) Antibody, IgG: <1 AI

## 2023-06-02 LAB — ANA: Anti Nuclear Antibody (ANA): NEGATIVE

## 2023-06-02 LAB — SJOGRENS SYNDROME-B EXTRACTABLE NUCLEAR ANTIBODY: SSB (La) (ENA) Antibody, IgG: <1 AI

## 2023-06-02 LAB — URIC ACID: Uric Acid, Serum: 4 mg/dL (ref 2.5–7.0)

## 2023-06-02 LAB — CYCLIC CITRUL PEPTIDE ANTIBODY, IGG: Cyclic Citrullin Peptide Ab: <16 U

## 2023-06-02 NOTE — Progress Notes (Signed)
CBC normal, creatinine is mildly elevated.  Rheumatoid factor is mildly elevated anti-CCP negative, sed rate normal, uric acid normal, ANA negative, ENA panel negative, antiphospholipid antibodies negative.  I will discuss results at the follow-up visit.

## 2023-06-16 NOTE — Progress Notes (Signed)
 Office Visit Note  Patient: Erin Cruz             Date of Birth: 1946/06/16           MRN: [id number]             PCP: Chrystal Lamarr RAMAN, MD Referring: Warner Lamar PEDLAR, PA-C Visit Date: 06/30/2023 Occupation: @GUAROCC @  Subjective:  Joint stiffness  History of Present Illness: Erin Cruz is a 77 y.o. female with polyarthralgia.  She returns today after her last visit on May 27, 2023.  Patient states that she continues to have some pain and stiffness in her hands which is manageable.  She went to see a podiatrist and had cortisone injection in the dorsum of her left foot which was very helpful.  She is not having much discomfort in her feet now.  She continues to have some discomfort in her knees and her feet.  She has not noticed any joint swelling.  She also has intermittent pain in her lower back.    Activities of Daily Living:  Patient reports morning stiffness for 0 minute.   Patient Denies nocturnal pain.  Difficulty dressing/grooming: Denies Difficulty climbing stairs: Denies Difficulty getting out of chair: Denies Difficulty using hands for taps, buttons, cutlery, and/or writing: Reports  Review of Systems  Constitutional:  Negative for fatigue.  HENT:  Positive for mouth dryness. Negative for mouth sores.   Eyes:  Positive for dryness.  Respiratory:  Negative for shortness of breath.   Cardiovascular:  Negative for chest pain and palpitations.  Gastrointestinal:  Negative for blood in stool, constipation and diarrhea.  Endocrine: Positive for increased urination.  Genitourinary:  Negative for involuntary urination.  Musculoskeletal:  Positive for joint pain, joint pain, joint swelling, myalgias and myalgias. Negative for gait problem, muscle weakness, morning stiffness and muscle tenderness.  Skin:  Positive for sensitivity to sunlight. Negative for color change, rash and hair loss.  Allergic/Immunologic: Negative for susceptible to infections.   Neurological:  Negative for dizziness and headaches.  Hematological:  Negative for swollen glands.  Psychiatric/Behavioral:  Positive for sleep disturbance. Negative for depressed mood. The patient is not nervous/anxious.     PMFS History:  Patient Active Problem List   Diagnosis Date Noted   Left corneal scar 01/02/2022   Blurred vision, left eye 12/27/2021   Posterior vitreous detachment, left eye 11/29/2021   Left epiretinal membrane 07/04/2021   Pseudophakia, left eye 05/02/2021   Cystoid macular edema of left eye 05/02/2021   BMI 25.0-25.9,adult 04/08/2017   History of spinal stenosis 09/17/2016   Deafness in right ear 03/19/2016   Osteopenia    GERD (gastroesophageal reflux disease)    DDD (degenerative disc disease), lumbar 10/05/2013   Pure hypercholesterolemia 04/08/2013   Basal cell carcinoma (BCC) of neck 06/24/1996    Past Medical History:  Diagnosis Date   Allergy    seasonal    Arthritis    hand    Cancer (HCC) 1998   neck basal cell   Cataract    left side removed    GERD (gastroesophageal reflux disease)    Hyperlipidemia    Osteopenia    Raynaud phenomenon     Family History  Problem Relation Age of Onset   Muscular dystrophy Mother        IBM   Congestive Heart Failure Mother    Colon cancer Father    Hyperlipidemia Father    Leukemia Father        due  to treatment for colon cancer   Colon polyps Father    Osteoarthritis Father    Hypertension Sister    Healthy Sister    Breast cancer Paternal Aunt    Breast cancer Paternal Uncle    Colon cancer Maternal Grandmother    Rheum arthritis Paternal Grandmother    Healthy Daughter    Healthy Son    Rheum arthritis Paternal Great-grandmother    Esophageal cancer Neg Hx    Rectal cancer Neg Hx    Stomach cancer Neg Hx    Past Surgical History:  Procedure Laterality Date   CATARACT EXTRACTION Right    CATARACT EXTRACTION W/ INTRAOCULAR LENS IMPLANT Left 2004   left    COLONOSCOPY     [place], 2013 DB- normal    CYSTECTOMY  2008   benign from neck    EYE SURGERY Left    HAND SURGERY  1965 & 1970   ganglion cyst x 2   POPLITEAL SYNOVIAL CYST EXCISION  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 Gust   TUBAL LIGATION  1980   UMBILICAL HERNIA REPAIR  1979   Social History   Social History Narrative   Exercise: Does exercise 7 days a week, classes 6 days a week and walking one day a week.   Diet: Loves vegetables, fruits, and quinoa. Chicken and fish. Yogurt, cheese, and almond milk. Drinks water.    Lives alone.   Daughter lives in [place] .   Son lives in [place] .   Immunization History  Administered Date(s) Administered   Influenza Split 03/11/2012, 04/01/2015   Influenza, High Dose Seasonal PF 03/17/2018   Influenza,inj,Quad PF,6+ Mos 04/08/2013, 03/19/2016, 04/08/2017   Influenza-Unspecified 03/13/2018   PFIZER(Purple Top)SARS-COV-2 Vaccination 07/31/2019, 08/25/2019   Pneumococcal Conjugate-13 07/23/2013   Pneumococcal Polysaccharide-23 04/24/2013, 12/20/2015   Tdap 04/20/2014   Zoster, Live 03/24/2012     Objective: Vital Signs: BP 135/78 (BP Location: Left Arm, Patient Position: Sitting, Cuff Size: Normal)   Pulse 69   Resp 14   Ht 5' 1 (1.549 m)   Wt 130 lb (59 kg)   BMI 24.56 kg/m    Physical Exam Vitals and nursing note reviewed.  Constitutional:      Appearance: She is well-developed.  HENT:     Head: Normocephalic and atraumatic.  Eyes:     Conjunctiva/sclera: Conjunctivae normal.  Cardiovascular:     Rate and Rhythm: Normal rate and regular rhythm.     Heart sounds: Normal heart sounds.  Pulmonary:     Effort: Pulmonary effort is normal.     Breath sounds: Normal breath sounds.  Abdominal:     General: Bowel sounds are normal.     Palpations: Abdomen is soft.  Musculoskeletal:     Cervical back: Normal range of motion.  Lymphadenopathy:     Cervical: No cervical  adenopathy.  Skin:    General: Skin is warm and dry.     Capillary Refill: Capillary refill takes less than 2 seconds.  Neurological:     Mental Status: She is alert and oriented to person, place, and time.  Psychiatric:        Behavior: Behavior normal.      Musculoskeletal Exam: Cervical, thoracic and lumbar spine were in good range of motion.  Shoulders, elbows, wrist, MCPs PIPs and DIPs with good range of motion.  She had mild thickening of bilateral second and  third MCP joints with no synovitis.  Hip joints and knee joints in good range of motion without any warmth swelling or effusion.  There was no tenderness over ankles or MTPs.  She had bilateral pes cavus and dorsal spurs.  CDAI Exam: CDAI Score: -- Patient Global: --; Provider Global: -- Swollen: --; Tender: -- Joint Exam 06/30/2023   No joint exam has been documented for this visit   There is currently no information documented on the homunculus. Go to the Rheumatology activity and complete the homunculus joint exam.  Investigation: No additional findings.  Imaging: No results found.   Recent Labs: Lab Results  Component Value Date   WBC 6.7 05/27/2023   HGB 14.0 05/27/2023   PLT 275 05/27/2023   NA 139 05/27/2023   K 4.0 05/27/2023   CL 100 05/27/2023   CO2 34 (H) 05/27/2023   GLUCOSE 85 05/27/2023   BUN 12 05/27/2023   CREATININE 1.09 (H) 05/27/2023   BILITOT 0.6 05/27/2023   ALKPHOS 77 04/14/2018   AST 23 05/27/2023   ALT 18 05/27/2023   PROT 7.3 05/27/2023   ALBUMIN 4.3 04/14/2018   CALCIUM  9.7 05/27/2023   GFRAA 66 04/14/2018   01/21/2023 ANA negative, ENA (double-stranded DNA, SSA, SSB, Smith, RNP) negative, C3-C4 normal, anticardiolipin negative, beta-2  GP 1 negative, sed rate 9, RF 15, anti-CCP negative, uric acid 4.0  Speciality Comments: No specialty comments available.  Procedures:  No procedures performed Allergies: Norco [hydrocodone -acetaminophen ] and Morphine  and codeine    Assessment / Plan:     Visit Diagnoses: Polyarthralgia - History of pain in multiple joints.  Patient had a good response to Medrol Dosepak prescribed by orthopedics in July 2024.01/21/2023 ANA negative, ENA (double-stranded DNA, SSA, SSB, Smith, RNP) negative, C3-C4 normal, anticardiolipin negative, beta-2  GP 1 negative, sed rate 9, RF 15, anti-CCP negative, uric acid 4.0.  Lab results were reviewed with the patient.  Chronic left shoulder pain -she has intermittent discomfort in the left shoulder.  She had good range of motion of her left shoulder joint without any discomfort.  Followed by Dr. Dorian.  Patient had cortisone injection in the past.  Primary osteoarthritis of both hands -she continues to have bilateral CMC PIP and DIP thickening.  Right CMC subluxation was noted.  X-rays from the last visit were reviewed with the patient.  Clinical and radiographic findings suggestive of osteoarthritis.  Detailed counseling osteoarthritis was provided.  She had bilateral second and third MCP thickening with no synovitis.  Advised to contact me if she develops any swelling.  She has positive rheumatoid factor at low titer.  There is also positive family history of rheumatoid arthritis in paternal grandmother and paternal great grandmother.  A handout on hand exercises was given.  Rheumatoid factor positive-she is low titer positive rheumatoid factor.  And patient denies any history of joint swelling.  I advised her to contact me if she develops any joint swelling.  Primary osteoarthritis of both knees -she has intermittent discomfort in her knee joints.  No warmth swelling or effusion was noted.  Right knee joint moderate osteoarthritis and left knee joint moderate to severe osteoarthritis and moderate chondromalacia patella was noted in bilateral knee joints.  Detailed counseling was provided.  A handout on lower extremity muscle strengthening exercise was given.  Primary osteoarthritis of both feet -she  had good response to cortisone injection to the dorsum of her left foot.  She has bilateral pes cavus.  She has been using arch support.  Clinical and radiographic findings were suggestive of osteoarthritis.    Orthotics were advised.  S/p left bunionectomy and second toe fusion.  Degeneration of intervertebral disc of lumbar region without discogenic back pain or lower extremity pain-she has chronic intermittent discomfort.  History of spinal stenosis - S/P discectomy and fusion in 2016 by Dr. Gust.  She had good response to the surgery.  History of osteopenia - DEXA scan was reviewed from April 09, 2021 which showed T-score of -2.4.  Use of calcium  rich diet and vitamin D  was advised.  The medical problems listed as follows:  Pure hypercholesterolemia  History of gastroesophageal reflux (GERD)  Basal cell carcinoma (BCC) of neck  Left corneal scar  Cystoid macular edema of left eye  Posterior vitreous detachment, left eye  Pseudophakia, left eye  Orders: No orders of the defined types were placed in this encounter.  No orders of the defined types were placed in this encounter.    Follow-Up Instructions: Return if symptoms worsen or fail to improve, for Osteoarthritis.   Erin Nash, MD  Note - This record has been created using Animal nutritionist.  Chart creation errors have been sought, but may not always  have been located. Such creation errors do not reflect on  the standard of medical care.

## 2023-06-30 ENCOUNTER — Ambulatory Visit: Payer: Medicare HMO | Attending: Rheumatology | Admitting: Rheumatology

## 2023-06-30 ENCOUNTER — Encounter: Payer: Self-pay | Admitting: Rheumatology

## 2023-06-30 VITALS — BP 135/78 | HR 69 | Resp 14 | Ht 61.0 in | Wt 130.0 lb

## 2023-06-30 DIAGNOSIS — M51369 Other intervertebral disc degeneration, lumbar region without mention of lumbar back pain or lower extremity pain: Secondary | ICD-10-CM

## 2023-06-30 DIAGNOSIS — E7800 Pure hypercholesterolemia, unspecified: Secondary | ICD-10-CM

## 2023-06-30 DIAGNOSIS — G8929 Other chronic pain: Secondary | ICD-10-CM

## 2023-06-30 DIAGNOSIS — M19041 Primary osteoarthritis, right hand: Secondary | ICD-10-CM

## 2023-06-30 DIAGNOSIS — M19072 Primary osteoarthritis, left ankle and foot: Secondary | ICD-10-CM

## 2023-06-30 DIAGNOSIS — H43812 Vitreous degeneration, left eye: Secondary | ICD-10-CM

## 2023-06-30 DIAGNOSIS — M170 Bilateral primary osteoarthritis of knee: Secondary | ICD-10-CM

## 2023-06-30 DIAGNOSIS — M2550 Pain in unspecified joint: Secondary | ICD-10-CM | POA: Diagnosis not present

## 2023-06-30 DIAGNOSIS — Z8261 Family history of arthritis: Secondary | ICD-10-CM

## 2023-06-30 DIAGNOSIS — C4441 Basal cell carcinoma of skin of scalp and neck: Secondary | ICD-10-CM

## 2023-06-30 DIAGNOSIS — M19042 Primary osteoarthritis, left hand: Secondary | ICD-10-CM

## 2023-06-30 DIAGNOSIS — H179 Unspecified corneal scar and opacity: Secondary | ICD-10-CM

## 2023-06-30 DIAGNOSIS — Z8739 Personal history of other diseases of the musculoskeletal system and connective tissue: Secondary | ICD-10-CM

## 2023-06-30 DIAGNOSIS — Z961 Presence of intraocular lens: Secondary | ICD-10-CM

## 2023-06-30 DIAGNOSIS — M25512 Pain in left shoulder: Secondary | ICD-10-CM

## 2023-06-30 DIAGNOSIS — H35352 Cystoid macular degeneration, left eye: Secondary | ICD-10-CM | POA: Diagnosis not present

## 2023-06-30 DIAGNOSIS — Z8719 Personal history of other diseases of the digestive system: Secondary | ICD-10-CM | POA: Diagnosis not present

## 2023-06-30 DIAGNOSIS — M19071 Primary osteoarthritis, right ankle and foot: Secondary | ICD-10-CM | POA: Diagnosis not present

## 2023-06-30 DIAGNOSIS — R768 Other specified abnormal immunological findings in serum: Secondary | ICD-10-CM

## 2023-06-30 NOTE — Patient Instructions (Signed)
Hand Exercises Hand exercises can be helpful for almost anyone. They can strengthen your hands and improve flexibility and movement. The exercises can also increase blood flow to the hands. These results can make your work and daily tasks easier for you. Hand exercises can be especially helpful for people who have joint pain from arthritis or nerve damage from using their hands over and over. These exercises can also help people who injure a hand. Exercises Most of these hand exercises are gentle stretching and motion exercises. It is usually safe to do them often throughout the day. Warming up your hands before exercise may help reduce stiffness. You can do this with gentle massage or by placing your hands in warm water for 10-15 minutes. It is normal to feel some stretching, pulling, tightness, or mild discomfort when you begin new exercises. In time, this will improve. Remember to always be careful and stop right away if you feel sudden, very bad pain or your pain gets worse. You want to get better and be safe. Ask your health care provider which exercises are safe for you. Do exercises exactly as told by your provider and adjust them as told. Do not begin these exercises until told by your provider. Knuckle bend or "claw" fist  Stand or sit with your arm, hand, and all five fingers pointed straight up. Make sure to keep your wrist straight. Gently bend your fingers down toward your palm until the tips of your fingers are touching your palm. Keep your big knuckle straight and only bend the small knuckles in your fingers. Hold this position for 10 seconds. Straighten your fingers back to your starting position. Repeat this exercise 5-10 times with each hand. Full finger fist  Stand or sit with your arm, hand, and all five fingers pointed straight up. Make sure to keep your wrist straight. Gently bend your fingers into your palm until the tips of your fingers are touching the middle of your  palm. Hold this position for 10 seconds. Extend your fingers back to your starting position, stretching every joint fully. Repeat this exercise 5-10 times with each hand. Straight fist  Stand or sit with your arm, hand, and all five fingers pointed straight up. Make sure to keep your wrist straight. Gently bend your fingers at the big knuckle, where your fingers meet your hand, and at the middle knuckle. Keep the knuckle at the tips of your fingers straight and try to touch the bottom of your palm. Hold this position for 10 seconds. Extend your fingers back to your starting position, stretching every joint fully. Repeat this exercise 5-10 times with each hand. Tabletop  Stand or sit with your arm, hand, and all five fingers pointed straight up. Make sure to keep your wrist straight. Gently bend your fingers at the big knuckle, where your fingers meet your hand, as far down as you can. Keep the small knuckles in your fingers straight. Think of forming a tabletop with your fingers. Hold this position for 10 seconds. Extend your fingers back to your starting position, stretching every joint fully. Repeat this exercise 5-10 times with each hand. Finger spread  Place your hand flat on a table with your palm facing down. Make sure your wrist stays straight. Spread your fingers and thumb apart from each other as far as you can until you feel a gentle stretch. Hold this position for 10 seconds. Bring your fingers and thumb tight together again. Hold this position for 10 seconds. Repeat  this exercise 5-10 times with each hand. Making circles  Stand or sit with your arm, hand, and all five fingers pointed straight up. Make sure to keep your wrist straight. Make a circle by touching the tip of your thumb to the tip of your index finger. Hold for 10 seconds. Then open your hand wide. Repeat this motion with your thumb and each of your fingers. Repeat this exercise 5-10 times with each hand. Thumb  motion  Sit with your forearm resting on a table and your wrist straight. Your thumb should be facing up toward the ceiling. Keep your fingers relaxed as you move your thumb. Lift your thumb up as high as you can toward the ceiling. Hold for 10 seconds. Bend your thumb across your palm as far as you can, reaching the tip of your thumb for the small finger (pinkie) side of your palm. Hold for 10 seconds. Repeat this exercise 5-10 times with each hand. Grip strengthening  Hold a stress ball or other soft ball in the middle of your hand. Slowly increase the pressure, squeezing the ball as much as you can without causing pain. Think of bringing the tips of your fingers into the middle of your palm. All of your finger joints should bend when doing this exercise. Hold your squeeze for 10 seconds, then relax. Repeat this exercise 5-10 times with each hand. Contact a health care provider if: Your hand pain or discomfort gets much worse when you do an exercise. Your hand pain or discomfort does not improve within 2 hours after you exercise. If you have either of these problems, stop doing these exercises right away. Do not do them again unless your provider says that you can. Get help right away if: You develop sudden, severe hand pain or swelling. If this happens, stop doing these exercises right away. Do not do them again unless your provider says that you can. This information is not intended to replace advice given to you by your health care provider. Make sure you discuss any questions you have with your health care provider. Document Revised: 06/25/2022 Document Reviewed: 06/25/2022 Else[employer] Patient Education  2024 [employer] Exercises for Chronic Knee Pain Chronic knee pain is pain that lasts longer than 3 months. For most people with chronic knee pain, exercise and weight loss is an important part of treatment. Your health care provider may want you to focus on: Making the muscles that  support your knee stronger. This can take pressure off your knee and reduce pain. Preventing knee stiffness. How far you can move your knee, keeping it there or making it farther. Losing weight (if this applies) to take pressure off your knee, lower your risk for injury, and make it easier for you to exercise. Your provider will help you make an exercise program that fits your needs and physical abilities. Below are simple, low-impact exercises you can do at home. Ask your provider or physical therapist how often you should do your exercise program and how many times to repeat each exercise. General safety tips  Get your provider's approval before doing any exercises. Start slowly and stop any time you feel pain. Do not exercise if your knee pain is flaring up. Warm up first. Stretching a cold muscle can cause an injury. Do 5-10 minutes of easy movement or light stretching before beginning your exercises. Do 5-10 minutes of low-impact activity (like walking or cycling) before starting strengthening exercises. Contact your provider any time you have pain during  or after exercising. Exercise can cause discomfort but should not be painful. It is normal to be a little stiff or sore after exercising. Stretching and range-of-motion exercises Front thigh stretch  Stand up straight and support your body by holding on to a chair or resting one hand on a wall. With your legs straight and close together, bend one knee to lift your heel up toward your butt. Using one hand for support, grab your ankle with your free hand. Pull your foot up closer toward your butt to feel the stretch in front of your thigh. Hold the stretch for 30 seconds. Repeat __________ times. Complete this exercise __________ times a day. Back thigh stretch  Sit on the floor with your back straight and your legs out straight in front of you. Place the palms of your hands on the floor and slide them toward your feet as you bend at the  hip. Try to touch your nose to your knees and feel the stretch in the back of your thighs. Hold for 30 seconds. Repeat __________ times. Complete this exercise __________ times a day. Calf stretch  Stand facing a wall. Place the palms of your hands flat against the wall, arms extended, and lean slightly against the wall. Get into a lunge position with one leg bent at the knee and the other leg stretched out straight behind you. Keep both feet facing the wall and increase the bend in your knee while keeping the heel of the other leg flat on the ground. You should feel the stretch in your calf. Hold for 30 seconds. Repeat __________ times. Complete this exercise __________ times a day. Strengthening exercises Straight leg lift  Lie on your back with one knee bent and the other leg out straight. Slowly lift the straight leg without bending the knee. Lift until your foot is about 12 inches (30 cm) off the floor. Hold for 3-5 seconds and slowly lower your leg. Repeat __________ times. Complete this exercise __________ times a day. Single leg dip  Stand between two chairs and put both hands on the backs of the chairs for support. Extend one leg out straight with your body weight resting on the heel of the standing leg. Slowly bend your standing knee to dip your body to the level that is comfortable for you. Hold for 3-5 seconds. Repeat __________ times. Complete this exercise __________ times a day. Hamstring curls  Stand straight, knees close together, facing the back of a chair. Hold on to the back of a chair with both hands. Keep one leg straight. Bend the other knee while bringing the heel up toward the butt until the knee is bent at a 90-degree angle (right angle). Hold for 3-5 seconds. Repeat __________ times. Complete this exercise __________ times a day. Wall squat  Stand straight with your back, hips, and head against a wall. Step forward one foot at a time with your back  still against the wall. Your feet should be 2 feet (61 cm) from the wall at shoulder width. Keeping your back, hips, and head against the wall, slide down the wall to as close to a sitting position as you can get. Hold for 5-10 seconds, then slowly slide back up. Repeat __________ times. Complete this exercise __________ times a day. Step-ups  Stand in front of a sturdy platform or stool that is about 6 inches (15 cm) high. Slowly step up with your left / right foot, keeping your knee in line with your hip  and foot. Do not let your knee bend so far that you cannot see your toes. Hold on to a chair for balance, but do not use it for support. Slowly unlock your knee and lower yourself to the starting position. Repeat __________ times. Complete this exercise __________ times a day. Contact a health care provider if: Your exercises cause pain. Your pain is worse after you exercise. Your pain prevents you from doing your exercises. This information is not intended to replace advice given to you by your health care provider. Make sure you discuss any questions you have with your health care provider. Document Revised: 06/25/2022 Document Reviewed: 06/25/2022 Elsevier Patient Education  2024 [employer] Low Back Sprain or Strain Rehab Ask your health care provider which exercises are safe for you. Do exercises exactly as told by your health care provider and adjust them as directed. It is normal to feel mild stretching, pulling, tightness, or discomfort as you do these exercises. Stop right away if you feel sudden pain or your pain gets worse. Do not begin these exercises until told by your health care provider. Stretching and range-of-motion exercises These exercises warm up your muscles and joints and improve the movement and flexibility of your back. These exercises also help to relieve pain, numbness, and tingling. Lumbar rotation  Lie on your back on a firm bed or the floor with your knees  bent. Straighten your arms out to your sides so each arm forms a 90-degree angle (right angle) with a side of your body. Slowly move (rotate) both of your knees to one side of your body until you feel a stretch in your lower back (lumbar). Try not to let your shoulders lift off the floor. Hold this position for __________ seconds. Tense your abdominal muscles and slowly move your knees back to the starting position. Repeat this exercise on the other side of your body. Repeat __________ times. Complete this exercise __________ times a day. Single knee to chest  Lie on your back on a firm bed or the floor with both legs straight. Bend one of your knees. Use your hands to move your knee up toward your chest until you feel a gentle stretch in your lower back and buttock. Hold your leg in this position by holding on to the front of your knee. Keep your other leg as straight as possible. Hold this position for __________ seconds. Slowly return to the starting position. Repeat with your other leg. Repeat __________ times. Complete this exercise __________ times a day. Prone extension on elbows  Lie on your abdomen on a firm bed or the floor (prone position). Prop yourself up on your elbows. Use your arms to help lift your chest up until you feel a gentle stretch in your abdomen and your lower back. This will place some of your body weight on your elbows. If this is uncomfortable, try stacking pillows under your chest. Your hips should stay down, against the surface that you are lying on. Keep your hip and back muscles relaxed. Hold this position for __________ seconds. Slowly relax your upper body and return to the starting position. Repeat __________ times. Complete this exercise __________ times a day. Strengthening exercises These exercises build strength and endurance in your back. Endurance is the ability to use your muscles for a long time, even after they get tired. Pelvic tilt This  exercise strengthens the muscles that lie deep in the abdomen. Lie on your back on a firm bed or the  floor with your legs extended. Bend your knees so they are pointing toward the ceiling and your feet are flat on the floor. Tighten your lower abdominal muscles to press your lower back against the floor. This motion will tilt your pelvis so your tailbone points up toward the ceiling instead of pointing to your feet or the floor. To help with this exercise, you may place a small towel under your lower back and try to push your back into the towel. Hold this position for __________ seconds. Let your muscles relax completely before you repeat this exercise. Repeat __________ times. Complete this exercise __________ times a day. Alternating arm and leg raises  Get on your hands and knees on a firm surface. If you are on a hard floor, you may want to use padding, such as an exercise mat, to cushion your knees. Line up your arms and legs. Your hands should be directly below your shoulders, and your knees should be directly below your hips. Lift your left leg behind you. At the same time, raise your right arm and straighten it in front of you. Do not lift your leg higher than your hip. Do not lift your arm higher than your shoulder. Keep your abdominal and back muscles tight. Keep your hips facing the ground. Do not arch your back. Keep your balance carefully, and do not hold your breath. Hold this position for __________ seconds. Slowly return to the starting position. Repeat with your right leg and your left arm. Repeat __________ times. Complete this exercise __________ times a day. Abdominal set with straight leg raise  Lie on your back on a firm bed or the floor. Bend one of your knees and keep your other leg straight. Tense your abdominal muscles and lift your straight leg up, 4-6 inches (10-15 cm) off the ground. Keep your abdominal muscles tight and hold this position for __________  seconds. Do not hold your breath. Do not arch your back. Keep it flat against the ground. Keep your abdominal muscles tense as you slowly lower your leg back to the starting position. Repeat with your other leg. Repeat __________ times. Complete this exercise __________ times a day. Single leg lower with bent knees Lie on your back on a firm bed or the floor. Tense your abdominal muscles and lift your feet off the floor, one foot at a time, so your knees and hips are bent in 90-degree angles (right angles). Your knees should be over your hips and your lower legs should be parallel to the floor. Keeping your abdominal muscles tense and your knee bent, slowly lower one of your legs so your toe touches the ground. Lift your leg back up to return to the starting position. Do not hold your breath. Do not let your back arch. Keep your back flat against the ground. Repeat with your other leg. Repeat __________ times. Complete this exercise __________ times a day. Posture and body mechanics Good posture and healthy body mechanics can help to relieve stress in your body's tissues and joints. Body mechanics refers to the movements and positions of your body while you do your daily activities. Posture is part of body mechanics. Good posture means: Your spine is in its natural S-curve position (neutral). Your shoulders are pulled back slightly. Your head is not tipped forward (neutral). Follow these guidelines to improve your posture and body mechanics in your everyday activities. Standing  When standing, keep your spine neutral and your feet about hip-width apart. Keep a  slight bend in your knees. Your ears, shoulders, and hips should line up. When you do a task in which you stand in one place for a long time, place one foot up on a stable object that is 2-4 inches (5-10 cm) high, such as a footstool. This helps keep your spine neutral. Sitting  When sitting, keep your spine neutral and keep your  feet flat on the floor. Use a footrest, if necessary, and keep your thighs parallel to the floor. Avoid rounding your shoulders, and avoid tilting your head forward. When working at a desk or a computer, keep your desk at a height where your hands are slightly lower than your elbows. Slide your chair under your desk so you are close enough to maintain good posture. When working at a computer, place your monitor at a height where you are looking straight ahead and you do not have to tilt your head forward or downward to look at the screen. Resting When lying down and resting, avoid positions that are most painful for you. If you have pain with activities such as sitting, bending, stooping, or squatting, lie in a position in which your body does not bend very much. For example, avoid curling up on your side with your arms and knees near your chest (fetal position). If you have pain with activities such as standing for a long time or reaching with your arms, lie with your spine in a neutral position and bend your knees slightly. Try the following positions: Lying on your side with a pillow between your knees. Lying on your back with a pillow under your knees. Lifting  When lifting objects, keep your feet at least shoulder-width apart and tighten your abdominal muscles. Bend your knees and hips and keep your spine neutral. It is important to lift using the strength of your legs, not your back. Do not lock your knees straight out. Always ask for help to lift heavy or awkward objects. This information is not intended to replace advice given to you by your health care provider. Make sure you discuss any questions you have with your health care provider. Document Revised: 10/14/2022 Document Reviewed: 08/28/2020 Elsevier Patient Education  2024 [employer].

## 2023-07-09 DIAGNOSIS — E785 Hyperlipidemia, unspecified: Secondary | ICD-10-CM | POA: Diagnosis not present

## 2023-07-09 LAB — LIPID PANEL
Chol/HDL Ratio: 3.5 {ratio} (ref 0.0–4.4)
Cholesterol, Total: 127 mg/dL (ref 100–199)
HDL: 36 mg/dL — ABNORMAL LOW (ref >39)
LDL Chol Calc (NIH): 72 mg/dL (ref 0–99)
Triglycerides: 103 mg/dL (ref 0–149)
VLDL Cholesterol Cal: 19 mg/dL (ref 5–40)

## 2023-07-14 NOTE — Progress Notes (Deleted)
Cardiology Office Note:  .   Date:  07/14/2023  ID:  Erin Cruz, DOB [birth year], MRN [id number] PCP: Shon Hale, MD  [facility] Providers Cardiologist:  Chrystie Nose, MD { Click to update primary MD,subspecialty MD or APP then REFRESH:1}   Patient Profile: .      PMH Dyslipidemia Myalgia on lovastatin Family history CAD Father had CABG x 5 in his 61s Aortic insufficiency Mild AI on echo 11/28/21 Hypertension Coronary artery disease CT calcium score 707 (94th percentile) Coronary calcifications noted in LAD and LCx  Referred to advanced lipid disorder clinic and seen by Dr. Rennis Golden 08/30/2019 for management of dyslipidemia.  She had a longstanding history of dyslipidemia and family history of dyslipidemia primarily in her sister.  Her father had coronary disease and underwent 5 vessel CABG in his 60s, ultimately died of colon cancer in his 14s.  She had no known coronary disease at the time.  Had previously been on lovastatin 20 mg daily in 2014 but it was discontinued due to muscle aches.  She was reportedly hesitant to try any different medications.  She was ultimately transitioned to ezetimibe which she has been on for years.  Her lipid profile at that time revealed total cholesterol 244, triglycerides 89, HDL 48, and LDL 181.  She underwent coronary calcium score which revealed CAC score of 707, 94th percentile for age/sex matched controls.  She was asymptomatic and remained very physically active, exercising regularly.  She was started on rosuvastatin 40 mg daily with good response.  Total cholesterol improved to 153, triglycerides 80, HDL 40, and LDL 98.  Ezetimibe was added.  And LDL improved to 73.  She reported some knee pain following a trip to [place] where she was frequently going up and down stairs.  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  She wondered if it was statins.  Through shared decision making, it was determined  that she did not need to undergo stress testing.  She was seen by Edd Fabian, NP on 12/08/2020 for shortness of breath.  Echocardiogram showed mild to moderate aortic insufficiency, moderate TR, and dilated aortic root at 38 mm with normal systolic function.  Blood pressure was a little elevated.  She had been on hydrochlorothiazide 12.5 mg daily with good response.  She noted improvement in her shortness of breath.  Last cardiology clinic visit was 07/11/2022 with Dr. Rennis Golden.  She had undergone repeat echocardiogram 11/2021 which revealed normal LVEF, mild diastolic dysfunction, normal structure of ascending aorta with no evidence of dilatation,and mild aortic insufficiency.  She was asymptomatic.  Lipid panel revealed total cholesterol 131, triglycerides 99, HDL 40, and LDL 72.       History of Present Illness: Erin Cruz is a *** 78 y.o. female ***   Discussed the use of AI scribe software for clinical note transcription with the patient, who gave verbal consent to proceed.   ROS: ***       Studies Reviewed: .        *** Risk Assessment/Calculations:   {Does this patient have ATRIAL FIBRILLATION?:[phone]} No BP recorded.  {Refresh Note OR Click here to enter BP  :1}***       Physical Exam:   VS:  There were no vitals taken for this visit.   Wt Readings from Last 3 Encounters:  06/30/23 130 lb (59 kg)  05/27/23 128 lb 12.8 oz (58.4 kg)  07/11/22 137 lb 12.8 oz (62.5  kg)    GEN: Well nourished, well developed in no acute distress NECK: No JVD; No carotid bruits CARDIAC: ***RRR, no murmurs, rubs, gallops RESPIRATORY:  Clear to auscultation without rales, wheezing or rhonchi  ABDOMEN: Soft, non-tender, non-distended EXTREMITIES:  No edema; No deformity     ASSESSMENT AND PLAN: .    Dyslipidemia Hypertension: CAD: Aortic valve insufficiency:    {Are you ordering a CV Procedure (e.g. stress test, cath, DCCV, TEE, etc)?   Press F2        :[id number]}  Dispo:  ***  Signed, Eligha Bridegroom, NP-C

## 2023-07-16 ENCOUNTER — Encounter (HOSPITAL_BASED_OUTPATIENT_CLINIC_OR_DEPARTMENT_OTHER): Payer: Self-pay

## 2023-07-16 ENCOUNTER — Encounter (HOSPITAL_BASED_OUTPATIENT_CLINIC_OR_DEPARTMENT_OTHER): Payer: Medicare HMO | Admitting: Nurse Practitioner

## 2023-08-11 NOTE — Progress Notes (Unsigned)
Cardiology Office Note:  .   Date:  08/13/2023  ID:  Erin Cruz, DOB May 04, 1946, MRN [id number] PCP: Erin Hale, MD  [facility] Providers Cardiologist:  Erin Nose, MD    Patient Profile: .      PMH Dyslipidemia Myalgia on lovastatin Family history CAD Father had CABG x 5 in his 40s Aortic insufficiency Mild AI on echo 11/28/21 Hypertension Coronary artery disease CT calcium score 707 (94th percentile) Coronary calcifications noted in LAD and LCx  Referred to advanced lipid disorder clinic and seen by Dr. Rennis Cruz 08/30/2019 for management of dyslipidemia.  She had a longstanding history of dyslipidemia and family history of dyslipidemia primarily in her sister.  Her father had coronary disease and underwent 5 vessel CABG in his 84s, ultimately died of colon cancer in his 22s.  She had no known coronary disease at the time.  Had previously been on lovastatin 20 mg daily in 2014 but it was discontinued due to muscle aches.  She was reportedly hesitant to try any different medications.  She was ultimately transitioned to ezetimibe which she has been on for years.  Her lipid profile at that time revealed total cholesterol 244, triglycerides 89, HDL 48, and LDL 181.  She underwent CT which revealed CAC score of 707, 94th percentile for age/sex matched controls.  She was asymptomatic and remained very physically active, exercising regularly.  She was started on rosuvastatin 40 mg daily with good response.  Total cholesterol improved to 153, triglycerides 80, HDL 40, and LDL 98.  Ezetimibe was added. LDL improved to 73.  She reported some knee pain following a trip to [place] where she was frequently going up and down stairs.  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  She wondered if it was statins.  Through shared decision making, it was determined that she did not need to undergo stress testing.  She was seen by Erin Fabian, NP on  12/08/2020 for shortness of breath.  Echocardiogram showed mild to moderate aortic insufficiency, moderate TR, and dilated aortic root at 38 mm with normal systolic function.  Blood pressure was a little elevated.  She had been on hydrochlorothiazide 12.5 mg daily with good response.  She noted improvement in her shortness of breath.  Last cardiology clinic visit was 07/11/2022 with Dr. Rennis Cruz.  She had undergone repeat echocardiogram 11/2021 which revealed normal LVEF, mild diastolic dysfunction, normal structure of ascending aorta with no evidence of dilatation,and mild aortic insufficiency.  She was asymptomatic.  Lipid panel revealed total cholesterol 131, triglycerides 99, HDL 40, and LDL 72.       History of Present Illness: Erin Cruz   Erin Cruz is a very pleasant 78 y.o. female who is here today for follow-up of dyslipidemia. She reports feeling well and remains active despite a recent ankle sprain and bone spur. She participates in a senior aerobics class a couple of times a week and walks as often as possible. Says her workouts are challenging despite the advanced age of the class members; "the instructors push [place]." She denies chest pain, shortness of breath, presyncope, syncope, palpitations, orthopnea or PND. She reports no side effects on current medications. She is aware of her cholesterol numbers and is satisfied with them. She reports a healthy diet, rarely eating out, and cooking most meals at home. She uses olive oil, avoids fried foods, and limits sweets. She also reports good control of her hypertension with medication, although she initially experienced  dizziness, which has since resolved.  Discussed the use of AI scribe software for clinical note transcription with the patient, who gave verbal consent to proceed.   ROS: See HPI       Studies Reviewed: Erin Cruz   EKG Interpretation Date/Time:  Wednesday August 13 2023 11:03:40 EST Ventricular Rate:  72 PR Interval:  124 QRS  Duration:  74 QT Interval:  398 QTC Calculation: 435 R Axis:   32  Text Interpretation: Normal sinus rhythm Normal ECG When compared with ECG of 15-Feb-2015 12:19, PREVIOUS ECG IS PRESENT No ST abnormality Confirmed by Erin Cruz [phone]) on 08/13/2023 11:10:38 AM    Risk Assessment/Calculations:             Physical Exam:   VS:  BP 122/66   Pulse 72   Ht 5\' 2"  (1.575 m)   Wt 130 lb (59 kg)   SpO2 95%   BMI 23.78 kg/m    Wt Readings from Last 3 Encounters:  08/13/23 130 lb (59 kg)  06/30/23 130 lb (59 kg)  05/27/23 128 lb 12.8 oz (58.4 kg)    GEN: Well nourished, well developed in no acute distress NECK: No JVD; No carotid bruits CARDIAC: RRR, soft murmur. No rubs, gallops RESPIRATORY:  Clear to auscultation without rales, wheezing or rhonchi  ABDOMEN: Soft, non-tender, non-distended EXTREMITIES:  No edema; No deformity     ASSESSMENT AND PLAN: .    Dyslipidemia LDL goal < 70: Lipid panel completed 07/09/2023 with total cholesterol 127, triglycerides 103, HDL 36, and LDL 72.  LDL is consistent with previous findings over the past 2 years.  She is tolerating rosuvastatin and ezetimibe without concerning side effects. She is active with regular exercise and eats a healthy diet. Encouraged her to continue healthy lifestyle along with rosuvastatin and ezetimibe.   Hypertension: BP is well controlled. She denies concerning side effects.  Renal function stable on labs completed 05/27/2023. Continue hydrochlorothiazide.  CAD: CT calcium score 08/2019 with CAC score of 707 (94th percentile) with coronary calcifications noted in the LAD and LCx. She denies chest pain, dyspnea, or other symptoms concerning for angina.EKG today is unremarkable. She is completely asymptomatic. No indication for further ischemic evaluation at this time. LDL is close to goal at 72. She leads a healthy lifestyle focusing on prevention including heart healthy mostly plant based diet avoiding saturated fat,  processed foods, simple carbohydrates, and sugar along with aiming for at least 150 minutes of moderate intensity exercise each week.   Valve disease: Echocardiogram 11/2021 reveals mild aortic valve insufficiency, mild MR, normal LVEF. She is asymptomatic.  Reviewed symptoms indicative for worsening valve disease for her to report.  We will continue to follow clinically for now.       Disposition: 1 year with Dr. Rennis Cruz or me  Signed, Erin Bridegroom, NP-C

## 2023-08-13 ENCOUNTER — Telehealth (HOSPITAL_BASED_OUTPATIENT_CLINIC_OR_DEPARTMENT_OTHER): Payer: Self-pay | Admitting: *Deleted

## 2023-08-13 ENCOUNTER — Encounter (HOSPITAL_BASED_OUTPATIENT_CLINIC_OR_DEPARTMENT_OTHER): Payer: Self-pay | Admitting: Nurse Practitioner

## 2023-08-13 ENCOUNTER — Ambulatory Visit (HOSPITAL_BASED_OUTPATIENT_CLINIC_OR_DEPARTMENT_OTHER): Payer: Medicare HMO | Admitting: Nurse Practitioner

## 2023-08-13 VITALS — BP 122/66 | HR 72 | Ht 62.0 in | Wt 130.0 lb

## 2023-08-13 DIAGNOSIS — R931 Abnormal findings on diagnostic imaging of heart and coronary circulation: Secondary | ICD-10-CM

## 2023-08-13 DIAGNOSIS — I340 Nonrheumatic mitral (valve) insufficiency: Secondary | ICD-10-CM

## 2023-08-13 DIAGNOSIS — I351 Nonrheumatic aortic (valve) insufficiency: Secondary | ICD-10-CM | POA: Diagnosis not present

## 2023-08-13 NOTE — Telephone Encounter (Signed)
S/w pt to start heading in early for appt due to inclement weather.

## 2023-08-13 NOTE — Patient Instructions (Signed)
Medication Instructions:  Your physician recommends that you continue on your current medications as directed. Please refer to the Current Medication list given to you today.  *If you need a refill on your cardiac medications before your next appointment, please call your pharmacy*   Lab Work: None Ordered today  **You will need a lipid panel in 1 year prior to your office visit If you have labs (blood work) drawn today and your tests are completely normal, you will receive your results only by: MyChart Message (if you have MyChart) OR A paper copy in the mail If you have any lab test that is abnormal or we need to change your treatment, we will call you to review the results.   Testing/Procedures: None Ordered   Follow-Up: At [facility], you and your health needs are our priority.  As part of our continuing mission to provide you with exceptional heart care, we have created designated Provider Care Teams.  These Care Teams include your primary Cardiologist (physician) and Advanced Practice Providers (APPs -  Physician Assistants and Nurse Practitioners) who all work together to provide you with the care you need, when you need it.  We recommend signing up for the patient portal called "MyChart".  Sign up information is provided on this After Visit Summary.  MyChart is used to connect with patients for Virtual Visits (Telemedicine).  Patients are able to view lab/test results, encounter notes, upcoming appointments, etc.  Non-urgent messages can be sent to your provider as well.   To learn more about what you can do with MyChart, go to [email or web address].    Your next appointment:   1 year(s)  Provider:   K. Italy Hilty, MD or Eligha Bridegroom, NP

## 2023-08-31 ENCOUNTER — Other Ambulatory Visit: Payer: Self-pay | Admitting: Internal Medicine

## 2023-09-03 IMAGING — MG MM DIGITAL SCREENING BILAT W/ TOMO AND CAD
8 series · 9 of 24 positions shown · non-contrast
Comparison: Previous exam(s).

CLINICAL DATA: Screening.

EXAM:
DIGITAL SCREENING BILATERAL MAMMOGRAM WITH TOMOSYNTHESIS AND CAD
TECHNIQUE: Bilateral screening digital craniocaudal and mediolateral oblique
mammograms were obtained. Bilateral screening digital breast
tomosynthesis was performed. The images were evaluated with
computer-aided detection.

[L CC synth-2D]
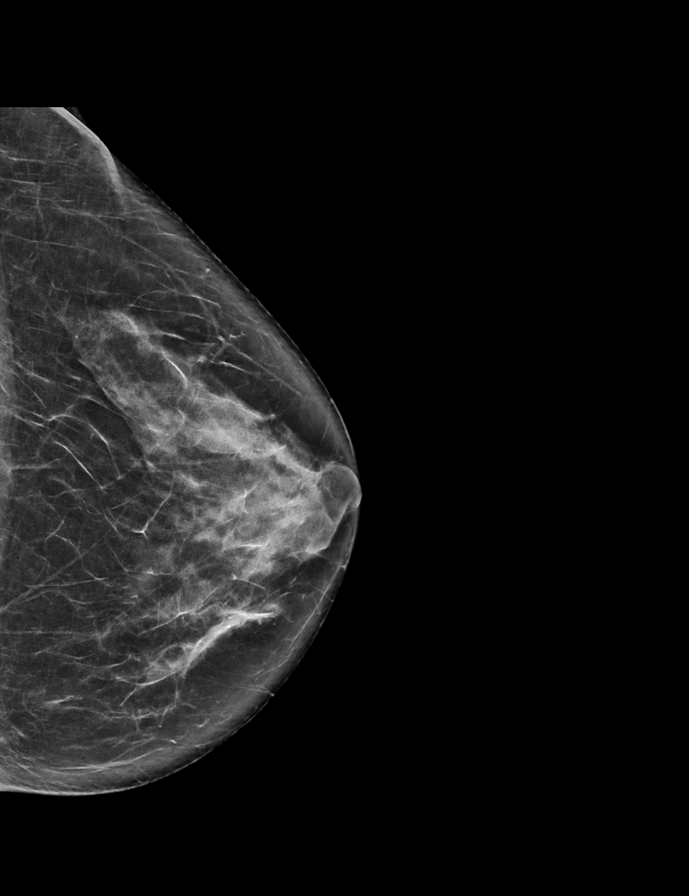

[R MLO synth-2D]
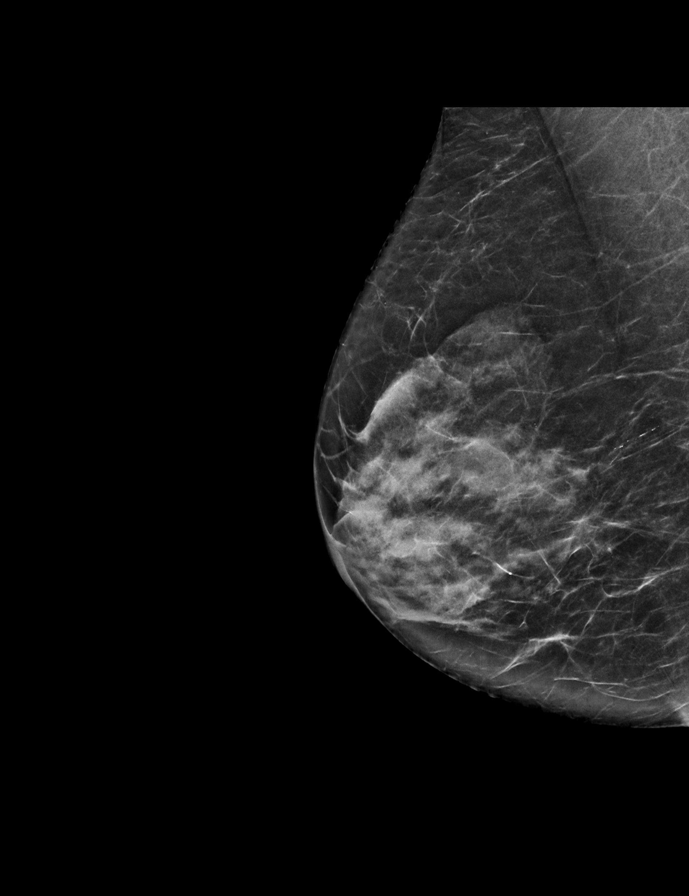

[L MLO synth-2D]
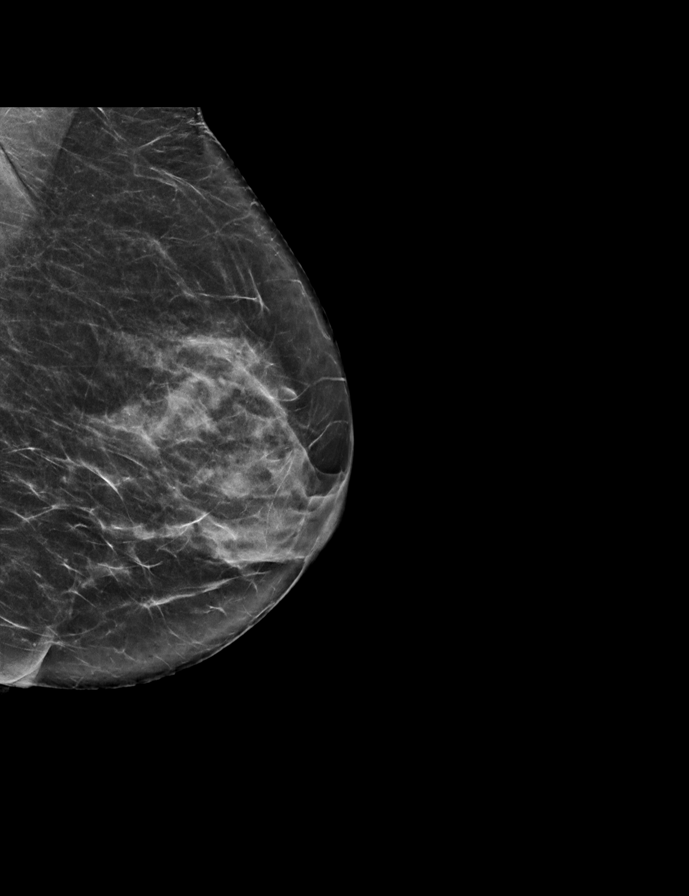

[R CC synth-2D]
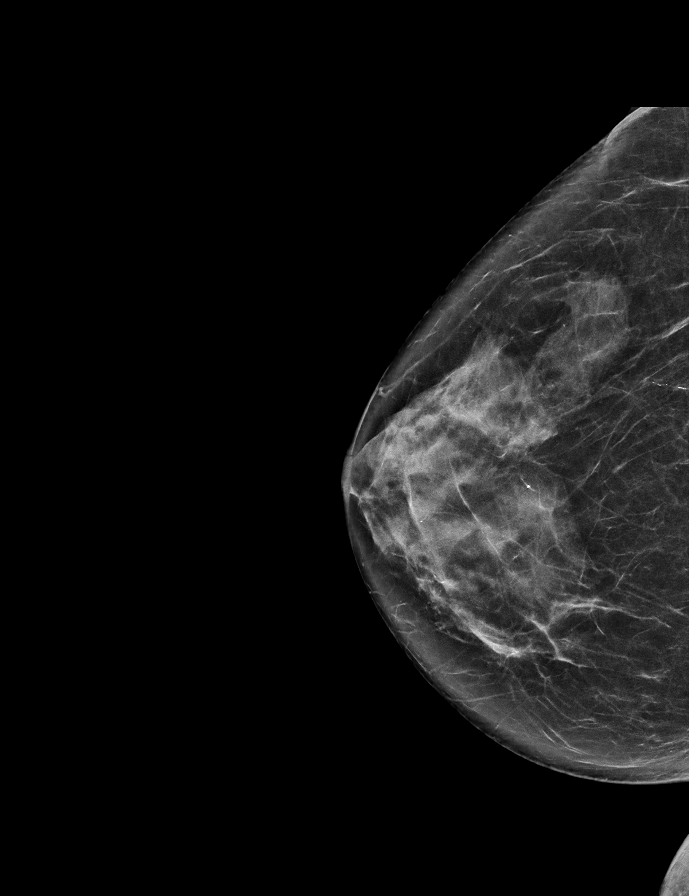

[R CC tomo · 2 of 62 frames shown]
[frame 21/62]
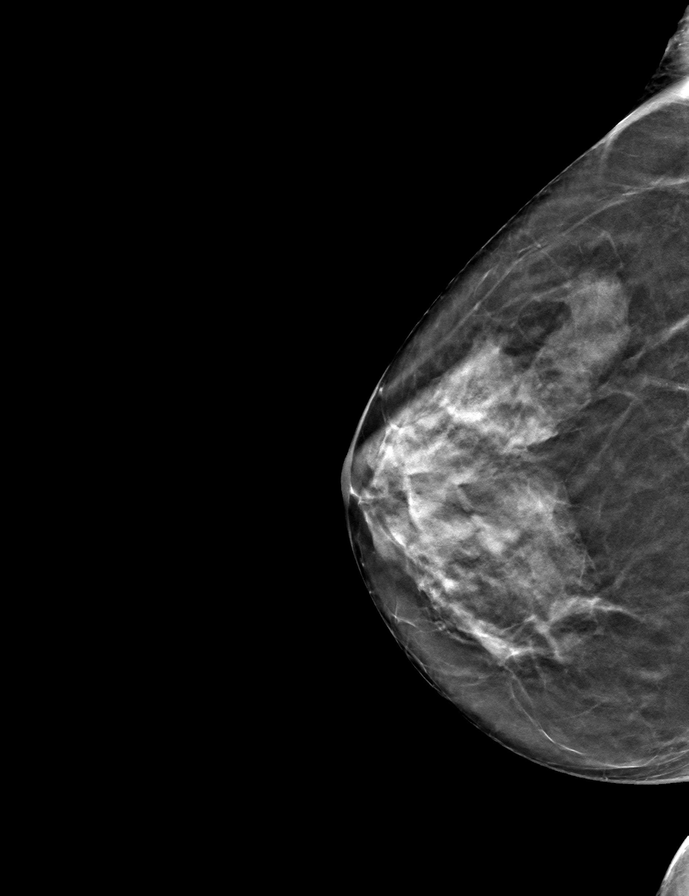
[frame 31/62]
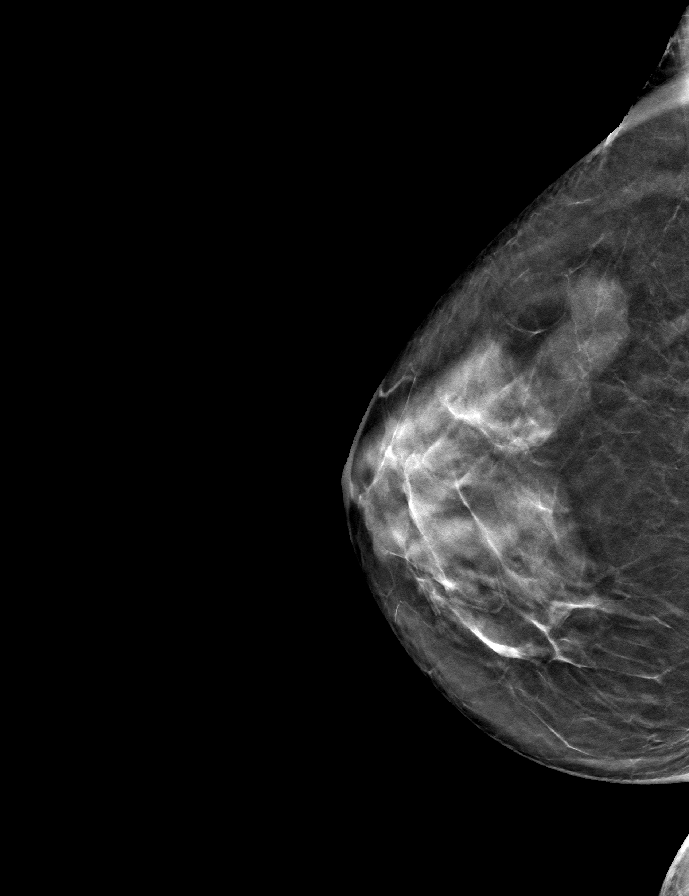

[R MLO tomo · tomo slice 31/62.0]
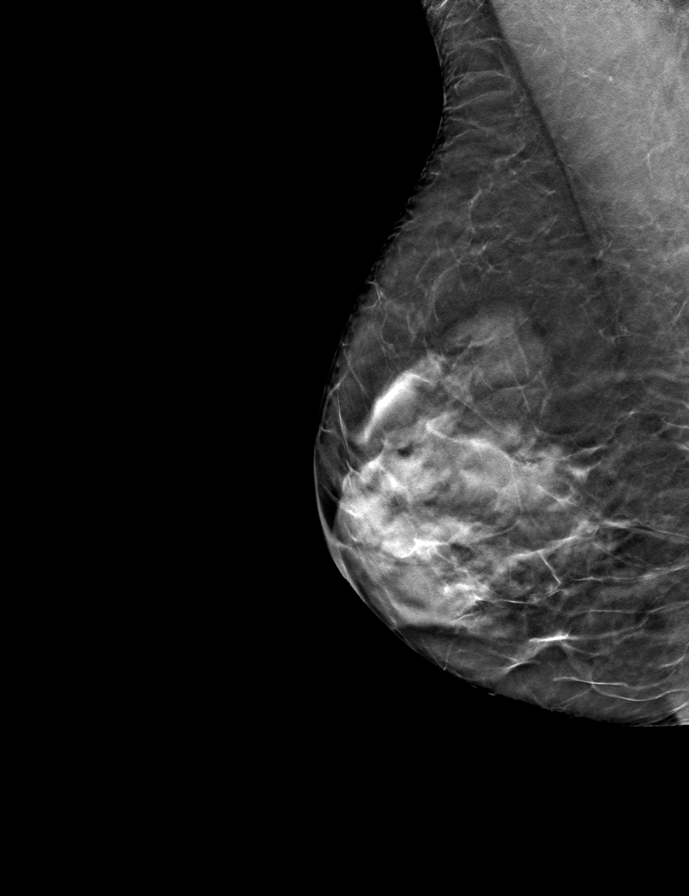

[L CC tomo · tomo slice 33/64.0]
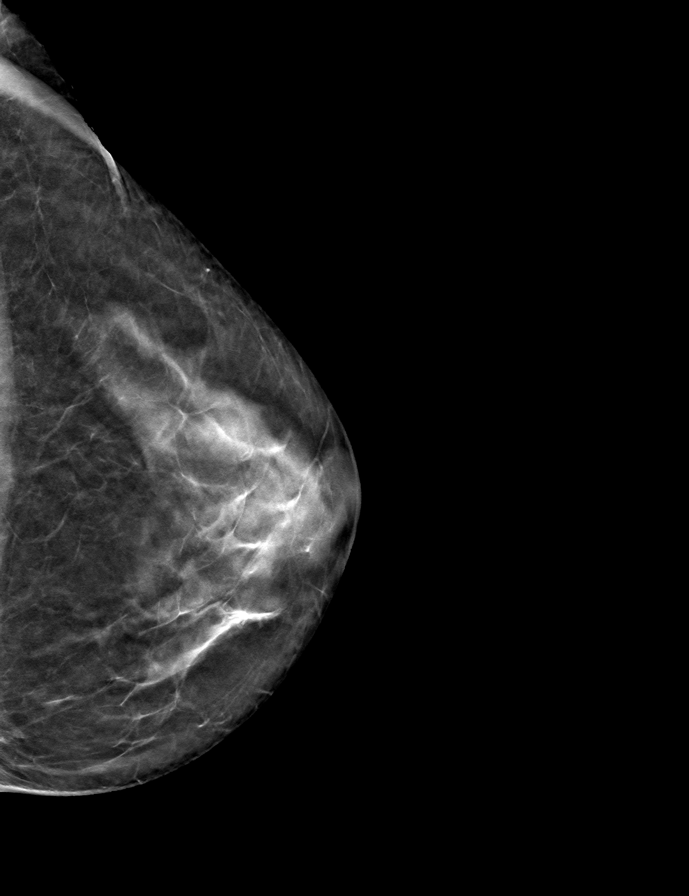

[L MLO tomo · tomo slice 32/63.0]
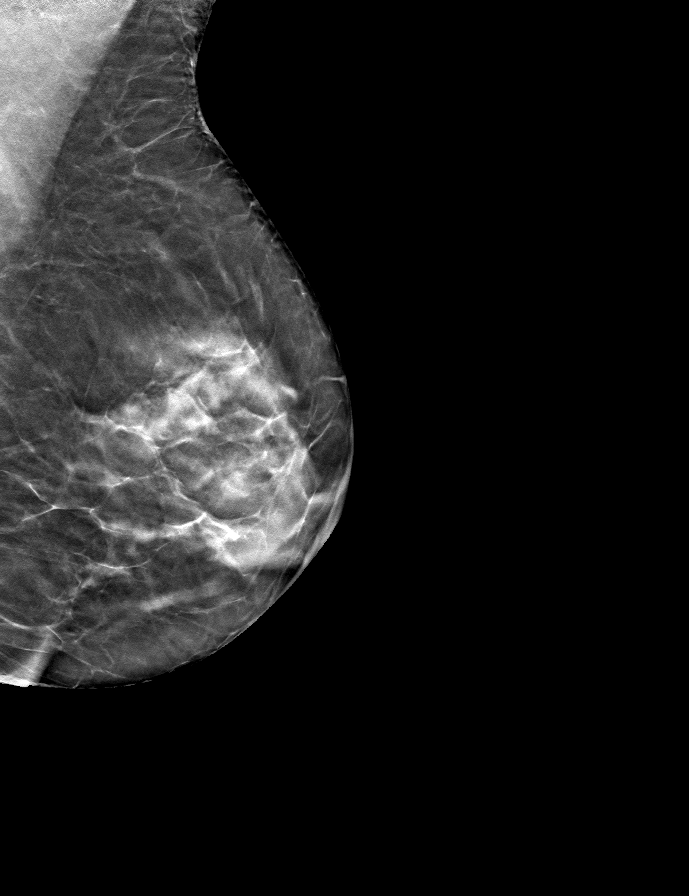

[9 of 24 positions shown; findings below may reference images not displayed]

ACR Breast Density Category c: The breast tissue is heterogeneously
dense, which may obscure small masses.
FINDINGS: There are no findings suspicious for malignancy.
IMPRESSION: No mammographic evidence of malignancy. A result letter of this
screening mammogram will be mailed directly to the patient.

RECOMMENDATION:
Screening mammogram in one year. (Code:Q3-W-BC3)

BI-RADS CATEGORY  1: Negative.

## 2023-09-08 DIAGNOSIS — M48061 Spinal stenosis, lumbar region without neurogenic claudication: Secondary | ICD-10-CM | POA: Diagnosis not present

## 2023-09-08 DIAGNOSIS — M4326 Fusion of spine, lumbar region: Secondary | ICD-10-CM | POA: Diagnosis not present

## 2023-09-08 DIAGNOSIS — M461 Sacroiliitis, not elsewhere classified: Secondary | ICD-10-CM | POA: Diagnosis not present

## 2023-12-15 DIAGNOSIS — M19072 Primary osteoarthritis, left ankle and foot: Secondary | ICD-10-CM | POA: Diagnosis not present

## 2023-12-15 DIAGNOSIS — M792 Neuralgia and neuritis, unspecified: Secondary | ICD-10-CM | POA: Diagnosis not present

## 2024-01-05 ENCOUNTER — Other Ambulatory Visit: Payer: Self-pay | Admitting: Family Medicine

## 2024-01-05 DIAGNOSIS — Z1231 Encounter for screening mammogram for malignant neoplasm of breast: Secondary | ICD-10-CM

## 2024-01-15 ENCOUNTER — Ambulatory Visit
Admission: RE | Admit: 2024-01-15 | Discharge: 2024-01-15 | Disposition: A | Discharge status: Home / Self Care | Source: Ambulatory Visit

## 2024-01-15 DIAGNOSIS — Z1231 Encounter for screening mammogram for malignant neoplasm of breast: Secondary | ICD-10-CM | POA: Diagnosis not present

## 2024-03-01 DIAGNOSIS — H35372 Puckering of macula, left eye: Secondary | ICD-10-CM | POA: Diagnosis not present

## 2024-05-14 ENCOUNTER — Other Ambulatory Visit: Payer: Self-pay | Admitting: Internal Medicine

## 2024-05-17 ENCOUNTER — Other Ambulatory Visit: Payer: Self-pay | Admitting: Internal Medicine

## 2024-06-29 ENCOUNTER — Telehealth (HOSPITAL_BASED_OUTPATIENT_CLINIC_OR_DEPARTMENT_OTHER): Payer: Self-pay | Admitting: Nurse Practitioner

## 2024-06-29 NOTE — Telephone Encounter (Signed)
 Will forward this message to Rosaline Bane, NP to advise if she'd like pt to have labs done prior to seeing her in lipid clinic on 3/4.    Pts last lipid check was Jan 2025, where no changes were made by Dr. Mona.   Our office will follow-up with the pt accordingly thereafter, once [facility] advises.

## 2024-06-29 NOTE — Telephone Encounter (Signed)
" °  Patient will need orders for labs prior to her Lipid appt on 08/25/24 "

## 2024-06-30 NOTE — Telephone Encounter (Signed)
 No additional labs are needed prior to office visit.

## 2024-08-25 ENCOUNTER — Encounter (HOSPITAL_BASED_OUTPATIENT_CLINIC_OR_DEPARTMENT_OTHER): Admitting: Nurse Practitioner
